# Patient Record
Sex: Female | Born: 1955 | Race: White | Hispanic: No | Marital: Married | Smoking: Current every day smoker
Health system: Southern US, Community
[De-identification: ages and names within clinical notes are randomized; demographics above are authoritative.]

## PROBLEM LIST (undated history)

## (undated) DIAGNOSIS — R42 Dizziness and giddiness: Secondary | ICD-10-CM

## (undated) DIAGNOSIS — S46219A Strain of muscle, fascia and tendon of other parts of biceps, unspecified arm, initial encounter: Secondary | ICD-10-CM

## (undated) DIAGNOSIS — Z86718 Personal history of other venous thrombosis and embolism: Secondary | ICD-10-CM

## (undated) DIAGNOSIS — E119 Type 2 diabetes mellitus without complications: Secondary | ICD-10-CM

## (undated) DIAGNOSIS — M1990 Unspecified osteoarthritis, unspecified site: Secondary | ICD-10-CM

## (undated) DIAGNOSIS — G43909 Migraine, unspecified, not intractable, without status migrainosus: Secondary | ICD-10-CM

## (undated) HISTORY — PX: BICEPS TENDON REPAIR: SHX566

## (undated) HISTORY — PX: OTHER SURGICAL HISTORY: SHX169

## (undated) HISTORY — PX: SPINAL FUSION: SHX223

---

## 1978-03-05 DIAGNOSIS — I2699 Other pulmonary embolism without acute cor pulmonale: Secondary | ICD-10-CM

## 1978-03-05 HISTORY — DX: Other pulmonary embolism without acute cor pulmonale: I26.99

## 2006-03-05 HISTORY — PX: OTHER SURGICAL HISTORY: SHX169

## 2011-03-06 DIAGNOSIS — Z86718 Personal history of other venous thrombosis and embolism: Secondary | ICD-10-CM

## 2011-03-06 HISTORY — DX: Personal history of other venous thrombosis and embolism: Z86.718

## 2014-11-02 ENCOUNTER — Ambulatory Visit: Payer: BLUE CROSS/BLUE SHIELD

## 2014-11-02 ENCOUNTER — Encounter: Payer: Self-pay | Admitting: Emergency Medicine

## 2014-11-02 ENCOUNTER — Ambulatory Visit
Admission: EM | Admit: 2014-11-02 | Discharge: 2014-11-02 | Disposition: A | Discharge status: Home / Self Care | Payer: BLUE CROSS/BLUE SHIELD | Attending: Family Medicine | Admitting: Family Medicine

## 2014-11-02 DIAGNOSIS — S60052A Contusion of left little finger without damage to nail, initial encounter: Secondary | ICD-10-CM

## 2014-11-02 HISTORY — DX: Type 2 diabetes mellitus without complications: E11.9

## 2014-11-02 HISTORY — DX: Personal history of other venous thrombosis and embolism: Z86.718

## 2014-11-02 HISTORY — DX: Strain of muscle, fascia and tendon of other parts of biceps, unspecified arm, initial encounter: S46.219A

## 2014-11-02 NOTE — Discharge Instructions (Signed)
Take home medication as prescribed. Apply ice and elevate.   Follow up with orthopedic or primary care physician this week as needed for continued pain. Return to Urgent care or ER for increased pain, decreased sensation or movement, redness, new or worsening concerns.  Contusion A contusion is a deep bruise. Contusions are the result of an injury that caused bleeding under the skin. The contusion may turn blue, purple, or yellow. Minor injuries will give you a painless contusion, but more severe contusions may stay painful and swollen for a few weeks.  CAUSES  A contusion is usually caused by a blow, trauma, or direct force to an area of the body. SYMPTOMS   Swelling and redness of the injured area.  Bruising of the injured area.  Tenderness and soreness of the injured area.  Pain. DIAGNOSIS  The diagnosis can be made by taking a history and physical exam. An X-ray, CT scan, or MRI may be needed to determine if there were any associated injuries, such as fractures. TREATMENT  Specific treatment will depend on what area of the body was injured. In general, the best treatment for a contusion is resting, icing, elevating, and applying cold compresses to the injured area. Over-the-counter medicines may also be recommended for pain control. Ask your caregiver what the best treatment is for your contusion. HOME CARE INSTRUCTIONS   Put ice on the injured area.  Put ice in a plastic bag.  Place a towel between your skin and the bag.  Leave the ice on for 15-20 minutes, 3-4 times a day, or as directed by your health care provider.  Only take over-the-counter or prescription medicines for pain, discomfort, or fever as directed by your caregiver. Your caregiver may recommend avoiding anti-inflammatory medicines (aspirin, ibuprofen, and naproxen) for 48 hours because these medicines may increase bruising.  Rest the injured area.  If possible, elevate the injured area to reduce swelling. SEEK  IMMEDIATE MEDICAL CARE IF:   You have increased bruising or swelling.  You have pain that is getting worse.  Your swelling or pain is not relieved with medicines. MAKE SURE YOU:   Understand these instructions.  Will watch your condition.  Will get help right away if you are not doing well or get worse. Document Released: 11/29/2004 Document Revised: 02/24/2013 Document Reviewed: 12/25/2010 [facility] Patient Information 2015 [place], [place]. This information is not intended to replace advice given to you by your health care provider. Make sure you discuss any questions you have with your health care provider.

## 2014-11-02 NOTE — ED Notes (Signed)
Left pinky finger, stiff , painful and cannot move since this morninf

## 2014-11-02 NOTE — ED Provider Notes (Signed)
[facility] Emergency Department Provider Note  ____________________________________________  Time seen: Approximately 6:30 PM  I have reviewed the triage vital signs and the nursing notes.   HISTORY  Chief Complaint Finger Injury   HPI Nicole Lewis is a 59 y.o. female patient presents for the complaints of left pinky pain. Patient reports that pain has been present all day. Patient and spouse reports that yesterdayshe was doing a lot of cleaning and states that she did hit her hands multiple time. Patient states though that she felt fine last night. Denies fall or other injury. States pain is in left fifth finger. Denies radiation. Denies numbness or tingling sensation. States still has full range of motion but pain present with movement. States minimal pain at rest without movement. States that she took a home Norco earlier today which helped with pain. States here as she wanted to make sure it was not broken.   States current pain as 7 out of 10 and aching. Denies other pain. States that she is right-hand dominant. Denies break in skin, fever, numbness or other complaints.   Past Medical History  Diagnosis Date  . Diabetes mellitus without complication   . H/O blood clots   . Biceps muscle strain     right    There are no active problems to display for this patient.   Past Surgical History  Procedure Laterality Date  . Spinal fusion    . Biceps tendon repair    . Spinal fusion    . Aortic valve replacement (avr)/coronary artery bypass grafting (cabg)      Current Outpatient Rx  Name  Route  Sig  Dispense  Refill  . canagliflozin (INVOKANA) 300 MG TABS tablet   Oral   Take 300 mg by mouth daily before breakfast.         . carvedilol (COREG) 6.25 MG tablet   Oral   Take 6.25 mg by mouth 2 (two) times daily with a meal.         . gabapentin (NEURONTIN) 600 MG tablet   Oral   Take 600 mg by mouth 3 (three) times daily. 600,g in AM  1200 in PM         . HYDROcodone-acetaminophen (NORCO) 7.5-325 MG per tablet   Oral   Take 1 tablet by mouth every 4 (four) hours as needed for moderate pain.         Marland Kitchen insulin glargine (LANTUS) 100 UNIT/ML injection   Subcutaneous   Inject 50 Units into the skin at bedtime.         . insulin lispro (HUMALOG) 100 UNIT/ML injection   Subcutaneous   Inject 15 Units into the skin 3 (three) times daily before meals.         . metFORMIN (GLUCOPHAGE-XR) 750 MG 24 hr tablet   Oral   Take 750 mg by mouth 2 (two) times daily.         Marland Kitchen PARoxetine (PAXIL) 40 MG tablet   Oral   Take 40 mg by mouth every morning.         Marland Kitchen spironolactone (ALDACTONE) 25 MG tablet   Oral   Take 25 mg by mouth once.         Marland Kitchen tiZANidine (ZANAFLEX) 4 MG capsule   Oral   Take 4 mg by mouth 3 (three) times daily.         Marland Kitchen warfarin (COUMADIN) 3 MG tablet   Oral   Take 3 mg  by mouth daily. Alternate Andris Flurry Sat Sun with 1.5 mg           Allergies Contrast media and Tetracyclines & related  Family History  Problem Relation Age of Onset  . Diabetes Mother   . Cancer Mother     Social History Social History  Substance Use Topics  . Smoking status: Former Games developer  . Smokeless tobacco: Never Used  . Alcohol Use: No    Review of Systems Constitutional: No fever/chills Eyes: No visual changes. ENT: No sore throat. Cardiovascular: Denies chest pain. Respiratory: Denies shortness of breath. Gastrointestinal: No abdominal pain.  No nausea, no vomiting.  No diarrhea.  No constipation. Genitourinary: Negative for dysuria. Musculoskeletal: Negative for back pain. Left 5 finger pain.  Skin: Negative for rash. Neurological: Negative for headaches, focal weakness or numbness.  10-point ROS otherwise negative.  ____________________________________________   PHYSICAL EXAM:  VITAL SIGNS: ED Triage Vitals  Enc Vitals Group     BP 11/02/14 1744 140/82 mmHg     Pulse Rate 11/02/14  1744 79     Resp --      Temp 11/02/14 1744 98.1 F (36.7 C)     Temp Source 11/02/14 1744 Tympanic     SpO2 11/02/14 1744 98 %     Weight 11/02/14 1744 220 lb (99.791 kg)     Height 11/02/14 1744 5\' 5"  (1.651 m)     Head Cir --      Peak Flow --      Pain Score 11/02/14 1803 7     Pain Loc --      Pain Edu? --      Excl. in GC? --     Constitutional: Alert and oriented. Well appearing and in no acute distress. Eyes: Conjunctivae are normal. PERRL. EOMI. Head: Atraumatic.  Nose: No congestion/rhinnorhea.  Mouth/Throat: Mucous membranes are moist.  O Neck: No stridor.  No cervical spine tenderness to palpation. Hematological/Lymphatic/Immunilogical: No cervical lymphadenopathy. Cardiovascular: Normal rate, regular rhythm. Grossly normal heart sounds.  Good peripheral circulation. Respiratory: Normal respiratory effort.  No retractions. Lungs CTAB. Gastrointestinal: Soft and nontender. No distention. Normal Bowel sounds.   Musculoskeletal: No lower or upper extremity tenderness nor edema.  No joint effusions. Bilateral pedal pulses equal and easily palpated.  Except: left 5th mcp mild to mod TTP, minimal swelling and ecchymosis, left hand otherwise nontender, no tendon or motor deficit, no sensation deficit, cap refill <2secs.No erythema, no signs of infection. Distal radial pulses equal bilaterally. Bilateral hand grips equal.  Neurologic:  Normal speech and language. No gross focal neurologic deficits are appreciated. No gait instability. Skin:  Skin is warm, dry and intact. No rash noted. Psychiatric: Mood and affect are normal. Speech and behavior are normal.  ____________________________________________   LABS (all labs ordered are listed, but only abnormal results are displayed)  Labs Reviewed - No data to display ____________________________________________  RADIOLOGY LEFT HAND - COMPLETE 3+ VIEW  COMPARISON: None.  FINDINGS: There is no evidence of fracture or  dislocation. There is no evidence of arthropathy or other focal bone abnormality. Soft tissues are unremarkable.  IMPRESSION: Negative.   Electronically Signed By: Sherian Rein M.D. On: 11/02/2014 18:41  I, Renford Dills, personally viewed and evaluated these images (plain radiographs) as part of my medical decision making.   __________________________________________   PROCEDURES  Procedure(s) performed:   Left 5 finger splint and buddy tape 4/5 fingers by RN. Neurovascular intact post.  ____________________________________________   INITIAL IMPRESSION / ASSESSMENT  AND PLAN / ED COURSE  Pertinent labs & imaging results that were available during my care of the patient were reviewed by me and considered in my medical decision making (see chart for details).  Very well-appearing patient. Presents for the complaints of left fifth digit MCP pain. Pain fully reproducible on palpation per patient. No signs of infection. No erythema. Skin intact. Distal radial pulses intact bilaterally. Cap refill less than 2 seconds. Full range of motion and no tendon deficit. X-ray negative. Ice elevate will buddy tape fourth and fifth fingers for support. Reports has home pain medication as needed.  Follow up with PCP or ortho as needed. Discussed immediate return parameters include decreased sensation or movement, increased pain, redness or other concerns. Patient and spouse verbalized understanding and agreed to plan.  ____________________________________________   FINAL CLINICAL IMPRESSION(S) / ED DIAGNOSES  Final diagnoses:  Contusion of fifth finger, left, initial encounter       Renford Dills, NP 11/02/14 2145

## 2015-03-02 ENCOUNTER — Ambulatory Visit
Admission: EM | Admit: 2015-03-02 | Discharge: 2015-03-02 | Disposition: A | Discharge status: Home / Self Care | Payer: BLUE CROSS/BLUE SHIELD | Attending: Family Medicine | Admitting: Family Medicine

## 2015-03-02 DIAGNOSIS — J320 Chronic maxillary sinusitis: Secondary | ICD-10-CM

## 2015-03-02 DIAGNOSIS — G43809 Other migraine, not intractable, without status migrainosus: Secondary | ICD-10-CM

## 2015-03-02 MED ORDER — AMOXICILLIN-POT CLAVULANATE 875-125 MG PO TABS: 1.0000 | ORAL_TABLET | Freq: Two times a day (BID) | ORAL | Status: DC

## 2015-03-02 MED ORDER — ONDANSETRON 8 MG PO TBDP
8.0000 mg | ORAL_TABLET | Freq: Once | ORAL | Status: AC
  Administered 2015-03-02: 8 mg via ORAL

## 2015-03-02 MED ORDER — KETOROLAC TROMETHAMINE 60 MG/2ML IM SOLN
60.0000 mg | Freq: Once | INTRAMUSCULAR | Status: AC
  Administered 2015-03-02: 60 mg via INTRAMUSCULAR

## 2015-03-02 NOTE — ED Notes (Signed)
Started yesterday with sinusitis and right facial pain. This afternoon started with Migraine headache + nausea and photophobic

## 2015-03-02 NOTE — ED Provider Notes (Signed)
CSN: [id number]     Arrival date & time 03/02/15  1738 History   First MD Initiated Contact with Patient 03/02/15 1942     Chief Complaint  Patient presents with  . Facial Pain  . Migraine   (Consider location/radiation/quality/duration/timing/severity/associated sxs/prior Treatment) HPI Comments: 59 yo diabetic female with a h/o intermitent migraines (infrequent), diabetes, presents with a recent h/o runny nose, nasal congestion and now sinus pressure (right side)  sinus pain (right side), sinus headaches, dental pain. Today patient developed a right sided migraine headache with nausea and photophobia. Denies any fevers, chills, dental procedures,chest pains or shortness of breath.   The history is provided by the patient.    Past Medical History  Diagnosis Date  . Diabetes mellitus without complication (HCC)   . H/O blood clots   . Biceps muscle strain     right  . Depression    Past Surgical History  Procedure Laterality Date  . Spinal fusion    . Biceps tendon repair    . Spinal fusion    . Aortic ileac bypass     Family History  Problem Relation Age of Onset  . Diabetes Mother   . Cancer Mother   . Cancer Father   . Diabetes Father    Social History  Substance Use Topics  . Smoking status: Former Games developermoker  . Smokeless tobacco: Never Used  . Alcohol Use: No   OB History    No data available     Review of Systems  Allergies  Contrast media and Tetracyclines & related  Home Medications   Prior to Admission medications   Medication Sig Start Date End Date Taking? Authorizing Provider  carvedilol (COREG) 6.25 MG tablet Take 6.25 mg by mouth 2 (two) times daily with a meal.   Yes Historical Provider, MD  gabapentin (NEURONTIN) 600 MG tablet Take 600 mg by mouth 3 (three) times daily. 600,g in AM 1200 in PM   Yes Historical Provider, MD  HYDROcodone-acetaminophen (NORCO) 7.5-325 MG per tablet Take 1 tablet by mouth every 4 (four) hours as needed for moderate  pain.   Yes Historical Provider, MD  insulin glargine (LANTUS) 100 UNIT/ML injection Inject 50 Units into the skin at bedtime.   Yes Historical Provider, MD  insulin lispro (HUMALOG) 100 UNIT/ML injection Inject 15 Units into the skin 3 (three) times daily before meals.   Yes Historical Provider, MD  metFORMIN (GLUCOPHAGE-XR) 750 MG 24 hr tablet Take 750 mg by mouth 2 (two) times daily.   Yes Historical Provider, MD  PARoxetine (PAXIL) 40 MG tablet Take 40 mg by mouth every morning.   Yes Historical Provider, MD  spironolactone (ALDACTONE) 25 MG tablet Take 25 mg by mouth once.   Yes Historical Provider, MD  tiZANidine (ZANAFLEX) 4 MG capsule Take 4 mg by mouth 3 (three) times daily.   Yes Historical Provider, MD  warfarin (COUMADIN) 3 MG tablet Take 3 mg by mouth daily. Alternate Tue Thur Sat Sun with 1.5 mg   Yes Historical Provider, MD  amoxicillin-clavulanate (AUGMENTIN) 875-125 MG tablet Take 1 tablet by mouth 2 (two) times daily. 03/02/15   Payton Mccallumrlando Shenise Wolgamott, MD  canagliflozin (INVOKANA) 300 MG TABS tablet Take 300 mg by mouth daily before breakfast.    Historical Provider, MD   Meds Ordered and Administered this Visit   Medications  ketorolac (TORADOL) injection 60 mg (60 mg Intramuscular Given 03/02/15 1958)  ondansetron (ZOFRAN-ODT) disintegrating tablet 8 mg (8 mg Oral Given 03/02/15 1958)  BP 148/63 mmHg  Pulse 76  Temp(Src) 97.7 F (36.5 C) (Tympanic)  Resp 16  Ht  (1.651 m)  Wt 228 lb (103.42 kg)  BMI 37.94 kg/m2  SpO2 97% No data found.   Physical Exam  Constitutional: She is oriented to person, place, and time. She appears well-developed and well-nourished. No distress.  HENT:  Head: Normocephalic and atraumatic.  Right Ear: Tympanic membrane, external ear and ear canal normal.  Left Ear: Tympanic membrane, external ear and ear canal normal.  Nose: Mucosal edema and rhinorrhea present. No nose lacerations, sinus tenderness, nasal deformity, septal deviation or  nasal septal hematoma. No epistaxis.  No foreign bodies. Right sinus exhibits maxillary sinus tenderness and frontal sinus tenderness. Left sinus exhibits maxillary sinus tenderness and frontal sinus tenderness.  Mouth/Throat: Uvula is midline, oropharynx is clear and moist and mucous membranes are normal. No oropharyngeal exudate, posterior oropharyngeal edema, posterior oropharyngeal erythema or tonsillar abscesses.  Eyes: Conjunctivae and EOM are normal. Pupils are equal, round, and reactive to light. Right eye exhibits no discharge. Left eye exhibits no discharge. No scleral icterus.  Neck: Normal range of motion. Neck supple. No thyromegaly present.  Cardiovascular: Normal rate, regular rhythm and normal heart sounds.   Pulmonary/Chest: Effort normal and breath sounds normal. No respiratory distress. She has no wheezes. She has no rales.  Lymphadenopathy:    She has no cervical adenopathy.  Neurological: She is alert and oriented to person, place, and time. She has normal reflexes. No cranial nerve deficit. She exhibits normal muscle tone. Coordination normal.  Skin: No rash noted. She is not diaphoretic.  Nursing note and vitals reviewed.   ED Course  Procedures (including critical care time)  Labs Review Labs Reviewed - No data to display  Imaging Review No results found.   Visual Acuity Review  Right Eye Distance:   Left Eye Distance:   Bilateral Distance:    Right Eye Near:   Left Eye Near:    Bilateral Near:         MDM   1. Right maxillary sinusitis   2. Other migraine without status migrainosus, not intractable    Discharge Medication List as of 03/02/2015  8:36 PM    START taking these medications   Details  amoxicillin-clavulanate (AUGMENTIN) 875-125 MG tablet Take 1 tablet by mouth 2 (two) times daily., Starting 03/02/2015, Until Discontinued, Normal       1. diagnosis reviewed with patient 2. rx as per orders above; reviewed possible side effects,  interactions, risks and benefits  3. Patient given toradol  IM x1 and zofran  po with improvement of symptoms 4. Recommend supportive treatment with otc analgesics, otc flonase, home pain medication (Norco) prn 5. Follow-up prn if symptoms worsen or don't improve    Payton Mccallum, MD 03/02/15 2050

## 2015-05-14 ENCOUNTER — Encounter: Payer: Self-pay | Admitting: Emergency Medicine

## 2015-05-14 ENCOUNTER — Emergency Department
Admission: EM | Admit: 2015-05-14 | Discharge: 2015-05-14 | Disposition: A | Discharge status: Home / Self Care | Payer: BLUE CROSS/BLUE SHIELD | Attending: Emergency Medicine | Admitting: Emergency Medicine

## 2015-05-14 DIAGNOSIS — Z87891 Personal history of nicotine dependence: Secondary | ICD-10-CM | POA: Insufficient documentation

## 2015-05-14 DIAGNOSIS — G8929 Other chronic pain: Secondary | ICD-10-CM | POA: Insufficient documentation

## 2015-05-14 DIAGNOSIS — M545 Low back pain: Secondary | ICD-10-CM | POA: Diagnosis present

## 2015-05-14 DIAGNOSIS — Z792 Long term (current) use of antibiotics: Secondary | ICD-10-CM | POA: Diagnosis not present

## 2015-05-14 DIAGNOSIS — Z794 Long term (current) use of insulin: Secondary | ICD-10-CM | POA: Insufficient documentation

## 2015-05-14 DIAGNOSIS — Z79899 Other long term (current) drug therapy: Secondary | ICD-10-CM | POA: Diagnosis not present

## 2015-05-14 DIAGNOSIS — Z7901 Long term (current) use of anticoagulants: Secondary | ICD-10-CM | POA: Diagnosis not present

## 2015-05-14 DIAGNOSIS — Z7984 Long term (current) use of oral hypoglycemic drugs: Secondary | ICD-10-CM | POA: Diagnosis not present

## 2015-05-14 DIAGNOSIS — E119 Type 2 diabetes mellitus without complications: Secondary | ICD-10-CM | POA: Diagnosis not present

## 2015-05-14 DIAGNOSIS — M62830 Muscle spasm of back: Secondary | ICD-10-CM | POA: Diagnosis not present

## 2015-05-14 DIAGNOSIS — M5442 Lumbago with sciatica, left side: Secondary | ICD-10-CM | POA: Diagnosis not present

## 2015-05-14 MED ORDER — DIAZEPAM 5 MG PO TABS
5.0000 mg | ORAL_TABLET | Freq: Once | ORAL | Status: AC
  Administered 2015-05-14: 5 mg via ORAL
  Filled 2015-05-14: qty 1

## 2015-05-14 MED ORDER — HYDROMORPHONE HCL 1 MG/ML IJ SOLN
1.0000 mg | Freq: Once | INTRAMUSCULAR | Status: AC
  Administered 2015-05-14: 1 mg via INTRAMUSCULAR
  Filled 2015-05-14: qty 1

## 2015-05-14 NOTE — ED Notes (Signed)
Patient has hx of lumbar spinal fusion (L4, L5) and is on chronic pain mgt.  She states she has never has spasms like this before.  She is concerned about her right leg weakness which coincides with her severe spasms.  She consulted [employer] Nurse line and was told to seek tx here.

## 2015-05-14 NOTE — ED Notes (Addendum)
Pt c/o back pain and spasms across her lower back since 1130 today; [facility] and has taken her medications she's allowed at home; has not given her any relief; no relief with heat either; pt says pain is increasing; denies new injury

## 2015-05-14 NOTE — ED Provider Notes (Signed)
[facility] Emergency Department Provider Note  ____________________________________________  Time seen: Approximately 10:17 PM  I have reviewed the triage vital signs and the nursing notes.   HISTORY  Chief Complaint Back Pain    HPI Nicole Lewis is a 60 y.o. female here with complaint of back pain with severe spasms starting today. Patient states that prior to her muscle spasms she was cleaning out the refrigerator and then later went to the grocery store with her husband. She denies any known injury but suffers from chronic pain of her back and has had a lumbar spinal fusion at L4-L5. Patient is managed by a chronic pain clinic and is taking her medication today without any relief. She denies any incontinence of bowel or bladder. She states that she is only taking 1 pain pill today and is taken Zanaflex at approximately 12:00. She is also had problems with sciatica in her left leg that now feels some numbness radiating into her right leg. She is continued to be ambulatory since her pain started. Currently she rates her pain is 9/10.   Past Medical History  Diagnosis Date  . Diabetes mellitus without complication (HCC)   . H/O blood clots   . Biceps muscle strain     right    There are no active problems to display for this patient.   Past Surgical History  Procedure Laterality Date  . Spinal fusion    . Biceps tendon repair    . Spinal fusion    . Aortic ileac bypass      Current Outpatient Rx  Name  Route  Sig  Dispense  Refill  . amoxicillin-clavulanate (AUGMENTIN) 875-125 MG tablet   Oral   Take 1 tablet by mouth 2 (two) times daily.   20 tablet   0   . canagliflozin (INVOKANA) 300 MG TABS tablet   Oral   Take 300 mg by mouth daily before breakfast.         . carvedilol (COREG) 6.25 MG tablet   Oral   Take 6.25 mg by mouth 2 (two) times daily with a meal.         . gabapentin (NEURONTIN) 600 MG tablet   Oral   Take 600 mg by  mouth 3 (three) times daily. 600,g in AM 1200 in PM         . HYDROcodone-acetaminophen (NORCO) 7.5-325 MG per tablet   Oral   Take 1 tablet by mouth every 4 (four) hours as needed for moderate pain.         Marland Kitchen insulin glargine (LANTUS) 100 UNIT/ML injection   Subcutaneous   Inject 50 Units into the skin at bedtime.         . insulin lispro (HUMALOG) 100 UNIT/ML injection   Subcutaneous   Inject 15 Units into the skin 3 (three) times daily before meals.         . metFORMIN (GLUCOPHAGE-XR) 750 MG 24 hr tablet   Oral   Take 750 mg by mouth 2 (two) times daily.         Marland Kitchen PARoxetine (PAXIL) 40 MG tablet   Oral   Take 40 mg by mouth every morning.         Marland Kitchen spironolactone (ALDACTONE) 25 MG tablet   Oral   Take 25 mg by mouth once.         Marland Kitchen tiZANidine (ZANAFLEX) 4 MG capsule   Oral   Take 4 mg by mouth 3 (three) times  daily.         . warfarin (COUMADIN) 3 MG tablet   Oral   Take 3 mg by mouth daily. Alternate Andris Flurryue Thur Sat Sun with 1.5 mg           Allergies Contrast media and Tetracyclines & related  Family History  Problem Relation Age of Onset  . Diabetes Mother   . Cancer Mother   . Cancer Father   . Diabetes Father     Social History Social History  Substance Use Topics  . Smoking status: Former Games developermoker  . Smokeless tobacco: Never Used  . Alcohol Use: No    Review of Systems Constitutional: No fever/chills Cardiovascular: Denies chest pain. Respiratory: Denies shortness of breath. Gastrointestinal: No abdominal pain.  No nausea, no vomiting.  No diarrhea.  No constipation. Genitourinary: Negative for dysuria. Musculoskeletal: Positive for chronic back pain. Skin: Negative for rash. Neurological: Negative for headaches, focal weakness. Left sciatica with mild sensation changes in the right leg.  10-point ROS otherwise negative.  ____________________________________________   PHYSICAL EXAM:  VITAL SIGNS: ED Triage Vitals  Enc  Vitals Group     BP 05/14/15 2033 124/76 mmHg     Pulse Rate 05/14/15 2033 85     Resp 05/14/15 2033 18     Temp 05/14/15 2033 98.1 F (36.7 C)     Temp Source 05/14/15 2033 Oral     SpO2 05/14/15 2033 97 %     Weight 05/14/15 2033 222 lb (100.699 kg)     Height 05/14/15 2033 5\' 5"  (1.651 m)     Head Cir --      Peak Flow --      Pain Score 05/14/15 2034 9     Pain Loc --      Pain Edu? --      Excl. in GC? --     Constitutional: Alert and oriented. Well appearing and in no acute distress. Eyes: Conjunctivae are normal. PERRL. EOMI. Head: Atraumatic. Nose: No congestion/rhinnorhea. Neck: No stridor.   Cardiovascular: Normal rate, regular rhythm. Grossly normal heart sounds.  Good peripheral circulation. Respiratory: Normal respiratory effort.  No retractions. Lungs CTAB. Gastrointestinal: Soft and nontender. No distention. No abdominal bruits. Musculoskeletal: Examination of the lower back there is no gross deformity however there is marked tenderness on palpation of L5-S1 area along with paravertebral muscles bilaterally. Range of motion is restricted secondary to patient's pain. Range of motion increases pain. Reflexes were 1+ bilaterally. There is good muscle strength in both legs. Motor sensory function intact. Gait was not tested secondary to patient's pain. Neurologic:  Normal speech and language. No gross focal neurologic deficits are appreciated.  Skin:  Skin is warm, dry and intact. No rash noted. Psychiatric: Mood and affect are normal. Speech and behavior are normal.  ____________________________________________   LABS (all labs ordered are listed, but only abnormal results are displayed)  Labs Reviewed - No data to display   ____________________________________________   PROCEDURES  Procedure(s) performed: None  Critical Care performed: No  ____________________________________________   INITIAL IMPRESSION / ASSESSMENT AND PLAN / ED COURSE  Pertinent  labs & imaging results that were available during my care of the patient were reviewed by me and considered in my medical decision making (see chart for details).  ----------------------------------------- 11:32 PM on 05/14/2015 ----------------------------------------- Patient is resting comfortably Loc after having had Dilaudid and Valium there is no continued muscle spasms. Patient is to increase her Zanaflex at home every 8 hours as  needed for muscle spasms and continue her normal medication. She is to follow-up with her doctor if any continued problems.  ____________________________________________   FINAL CLINICAL IMPRESSION(S) / ED DIAGNOSES  Final diagnoses:  Bilateral low back pain with left-sided sciatica  Spasm of lumbar paraspinous muscle      Tommi Rumps, PA-C 05/14/15 2332  Sharyn Creamer, MD 05/14/15 2357

## 2015-05-14 NOTE — Discharge Instructions (Signed)
Follow-up with your doctor if any continued problems. Increased Zanaflex to  every 8 hours as needed for muscle spasms. Continue your normal medication as directed

## 2016-06-29 ENCOUNTER — Emergency Department
Admission: EM | Admit: 2016-06-29 | Discharge: 2016-06-29 | Disposition: A | Discharge status: Home / Self Care | Payer: BLUE CROSS/BLUE SHIELD | Attending: Emergency Medicine | Admitting: Emergency Medicine

## 2016-06-29 DIAGNOSIS — M545 Low back pain, unspecified: Secondary | ICD-10-CM

## 2016-06-29 DIAGNOSIS — Z794 Long term (current) use of insulin: Secondary | ICD-10-CM | POA: Insufficient documentation

## 2016-06-29 DIAGNOSIS — E119 Type 2 diabetes mellitus without complications: Secondary | ICD-10-CM | POA: Diagnosis not present

## 2016-06-29 DIAGNOSIS — Z87891 Personal history of nicotine dependence: Secondary | ICD-10-CM | POA: Diagnosis not present

## 2016-06-29 DIAGNOSIS — M5442 Lumbago with sciatica, left side: Secondary | ICD-10-CM | POA: Insufficient documentation

## 2016-06-29 DIAGNOSIS — Z7901 Long term (current) use of anticoagulants: Secondary | ICD-10-CM | POA: Diagnosis not present

## 2016-06-29 DIAGNOSIS — G8929 Other chronic pain: Secondary | ICD-10-CM | POA: Insufficient documentation

## 2016-06-29 MED ORDER — ORPHENADRINE CITRATE 30 MG/ML IJ SOLN
60.0000 mg | Freq: Two times a day (BID) | INTRAMUSCULAR | Status: DC
  Administered 2016-06-29: 60 mg via INTRAMUSCULAR

## 2016-06-29 MED ORDER — DIAZEPAM 5 MG PO TABS
5.0000 mg | ORAL_TABLET | Freq: Once | ORAL | Status: AC
  Administered 2016-06-29: 5 mg via ORAL
  Filled 2016-06-29: qty 1

## 2016-06-29 MED ORDER — HYDROMORPHONE HCL 1 MG/ML IJ SOLN
0.5000 mg | Freq: Once | INTRAMUSCULAR | Status: AC
  Administered 2016-06-29: 0.5 mg via INTRAVENOUS
  Filled 2016-06-29: qty 1

## 2016-06-29 MED ORDER — ORPHENADRINE CITRATE 30 MG/ML IJ SOLN
INTRAMUSCULAR | Status: AC
  Administered 2016-06-29: 60 mg via INTRAMUSCULAR
  Filled 2016-06-29: qty 2

## 2016-06-29 MED ORDER — KETOROLAC TROMETHAMINE 30 MG/ML IJ SOLN
15.0000 mg | Freq: Once | INTRAMUSCULAR | Status: AC
  Administered 2016-06-29: 15 mg via INTRAVENOUS
  Filled 2016-06-29: qty 1

## 2016-06-29 NOTE — ED Provider Notes (Signed)
[facility] Emergency Department Provider Note   ____________________________________________   First MD Initiated Contact with Patient 06/29/16 1816     (approximate)  I have reviewed the triage vital signs and the nursing notes.   HISTORY  Chief Complaint Back Pain    HPI Nicole Lewis is a 61 y.o. female is here with complaint of low back pain.Patient states she has had 2 back surgeries in the past with the most recent in 2008.  She denies any recent injuries. Patient states she experienced back spasms with increased pain for the last 4 days. Generally she takes Norco and Zanaflex and did so this morning at approximately 5 AM. She states she has not taken any further medication today because she had to drive her to doctor's appointments and her arm. She states that when she got home she was unable to get out of the car. Her doctor's appointments in [place] today were unrelated to her back . Patient denies any urinary symptoms however she has had a history of kidney stones. Patient states that currently she has back pain with radiation into her left leg. She denies any paresthesias other than her radiculopathy, no incontinence of bowel or bladder or saddle anesthesias. Patient rates her pain as 10 over 10 at this time. Her pain is magnified with any amount of movement.   Past Medical History:  Diagnosis Date  . Biceps muscle strain    right  . Diabetes mellitus without complication (HCC)   . H/O blood clots     There are no active problems to display for this patient.   Past Surgical History:  Procedure Laterality Date  . aortic ileac bypass    . BICEPS TENDON REPAIR    . SPINAL FUSION    . SPINAL FUSION      Prior to Admission medications   Medication Sig Start Date End Date Taking? Authorizing Provider  canagliflozin (INVOKANA) 300 MG TABS tablet Take 300 mg by mouth daily before breakfast.    Historical Provider, MD  carvedilol (COREG) 6.25  MG tablet Take 6.25 mg by mouth 2 (two) times daily with a meal.    Historical Provider, MD  gabapentin (NEURONTIN) 600 MG tablet Take 600 mg by mouth 3 (three) times daily. 600,g in AM 1200 in PM    Historical Provider, MD  HYDROcodone-acetaminophen (NORCO) 7.5-325 MG per tablet Take 1 tablet by mouth every 4 (four) hours as needed for moderate pain.    Historical Provider, MD  insulin glargine (LANTUS) 100 UNIT/ML injection Inject 50 Units into the skin at bedtime.    Historical Provider, MD  insulin lispro (HUMALOG) 100 UNIT/ML injection Inject 15 Units into the skin 3 (three) times daily before meals.    Historical Provider, MD  metFORMIN (GLUCOPHAGE-XR) 750 MG 24 hr tablet Take 750 mg by mouth 2 (two) times daily.    Historical Provider, MD  PARoxetine (PAXIL) 40 MG tablet Take 40 mg by mouth every morning.    Historical Provider, MD  spironolactone (ALDACTONE) 25 MG tablet Take 25 mg by mouth once.    Historical Provider, MD  tiZANidine (ZANAFLEX) 4 MG capsule Take 4 mg by mouth 3 (three) times daily.    Historical Provider, MD  warfarin (COUMADIN) 3 MG tablet Take 3 mg by mouth daily. Alternate Andris Flurry Sat Sun with 1.5 mg    Historical Provider, MD    Allergies Contrast media [iodinated diagnostic agents] and Tetracyclines & related  Family History  Problem  Relation Age of Onset  . Diabetes Mother   . Cancer Mother   . Cancer Father   . Diabetes Father     Social History Social History  Substance Use Topics  . Smoking status: Former Games developer  . Smokeless tobacco: Never Used  . Alcohol use No    Review of Systems  Constitutional: No fever/chills Cardiovascular: Denies chest pain. Respiratory: Denies shortness of breath. Gastrointestinal: No abdominal pain.  No nausea, no vomiting.  Genitourinary: Negative for dysuria. Musculoskeletal:  Positive for acute and  chronic back pain. Positive for left leg radiculopathy. Skin: Negative for rash. Neurological: Negative for  focal  weakness or numbness. Endocrine:  Positive diabetes mellitus.  ____________________________________________   PHYSICAL EXAM:  VITAL SIGNS: ED Triage Vitals  Enc Vitals Group     BP 06/29/16 1731 (!) 142/55     Pulse Rate 06/29/16 1731 83     Resp 06/29/16 1731 18     Temp 06/29/16 1731 99.1 F (37.3 C)     Temp Source 06/29/16 1731 Oral     SpO2 06/29/16 1731 99 %     Weight 06/29/16 1732 228 lb (103.4 kg)     Height 06/29/16 1732  (1.651 m)     Head Circumference --      Peak Flow --      Pain Score 06/29/16 1731 10     Pain Loc --      Pain Edu? --      Excl. in GC? --     Constitutional: Alert and oriented. Well appearing and in no acute distress. Eyes: Conjunctivae are normal. PERRL. EOMI. Head: Atraumatic. Neck: No stridor.   Cardiovascular: Normal rate, regular rhythm. Grossly normal heart sounds.  Good peripheral circulation. Respiratory: Normal respiratory effort.  No retractions. Lungs CTAB. Gastrointestinal: Soft and nontender. No distention.  No CVA tenderness. Musculoskeletal: patient had a moderate amount of difficulty getting from the wheelchair to the stretcher and was supine when the examiner entered the room. Patient was unable to perform any movement without assistance. There is marked tenderness on palpation of the left lower lumbar paraspinous muscles. Active muscle spasms were noted. Straight leg raise is approximately 20 in the right leg and only approximately 10 in the left leg. Patient was able to flex and extend her feet against pressure without any difficulty.  Neurologic:  Normal speech and language. No gross focal neurologic deficits are appreciated.reflexes were 1+ bilaterally.  Skin:  Skin is warm, dry and intact. No rash noted. Psychiatric: Mood and affect are normal. Speech and behavior are normal.  ____________________________________________   LABS (all labs ordered are listed, but only abnormal results are displayed)  Labs  Reviewed - No data to display   PROCEDURES  Procedure(s) performed: None  Procedures  Critical Care performed: No  ____________________________________________   INITIAL IMPRESSION / ASSESSMENT AND PLAN / ED COURSE  Pertinent labs & imaging results that were available during my care of the patient were reviewed by me and considered in my medical decision making (see chart for details).  ----------------------------------------- 7:54 PM on 06/29/2016 ----------------------------------------- Patient is currently sitting on the side of the bed and states that she is feeling a lot  Better. Pain has resolved and patient is experiencing little to no muscle spasms at this time. Patient was instructed to follow-up with her primary care doctor or her pain clinic doctor at [facility]. She is continue her regular medication at home.       ____________________________________________  FINAL CLINICAL IMPRESSION(S) / ED DIAGNOSES  Final diagnoses:  Acute exacerbation of chronic low back pain  Acute left-sided low back pain with left-sided sciatica      NEW MEDICATIONS STARTED DURING THIS VISIT:  Current Discharge Medication List       Note:  This document was prepared using Dragon voice recognition software and may include unintentional dictation errors.    Tommi Rumps, PA-C 06/29/16 1956    Nita Sickle, MD 06/29/16 2104

## 2016-06-29 NOTE — ED Notes (Signed)
See triage note  States she developed lower back about 4 days ago  States pain is moving into left leg  She had some MD appts today in [place] and had to drive self  So pain has increased

## 2016-06-29 NOTE — ED Triage Notes (Signed)
Pt to triage via wheelchair. Helped from waiting chair to wheelchair by this RN. Pt states 4 days of back spasms and low back pain. Denies urinary symptoms. Alert and oriented. Denies injury or falls.

## 2016-06-29 NOTE — Discharge Instructions (Signed)
Continue regular medication at home. Use ice or heat to her back to help relieve your back pain. Call and Make an appointment with your pain management doctor or notify your primary care doctor if any continued medications are needed.

## 2017-02-08 ENCOUNTER — Emergency Department
Admission: EM | Admit: 2017-02-08 | Discharge: 2017-02-08 | Disposition: A | Discharge status: Home / Self Care | Payer: BLUE CROSS/BLUE SHIELD | Attending: Emergency Medicine | Admitting: Emergency Medicine

## 2017-02-08 ENCOUNTER — Emergency Department: Payer: BLUE CROSS/BLUE SHIELD

## 2017-02-08 DIAGNOSIS — M5442 Lumbago with sciatica, left side: Secondary | ICD-10-CM | POA: Insufficient documentation

## 2017-02-08 DIAGNOSIS — G8929 Other chronic pain: Secondary | ICD-10-CM

## 2017-02-08 DIAGNOSIS — Z87891 Personal history of nicotine dependence: Secondary | ICD-10-CM | POA: Insufficient documentation

## 2017-02-08 DIAGNOSIS — Z7901 Long term (current) use of anticoagulants: Secondary | ICD-10-CM | POA: Insufficient documentation

## 2017-02-08 DIAGNOSIS — Z794 Long term (current) use of insulin: Secondary | ICD-10-CM | POA: Insufficient documentation

## 2017-02-08 DIAGNOSIS — Z79899 Other long term (current) drug therapy: Secondary | ICD-10-CM | POA: Diagnosis not present

## 2017-02-08 DIAGNOSIS — M545 Low back pain: Secondary | ICD-10-CM | POA: Diagnosis present

## 2017-02-08 DIAGNOSIS — E119 Type 2 diabetes mellitus without complications: Secondary | ICD-10-CM | POA: Diagnosis not present

## 2017-02-08 MED ORDER — OMEPRAZOLE 10 MG PO CPDR: 10.0000 mg | DELAYED_RELEASE_CAPSULE | Freq: Every day | ORAL | 0 refills | Status: DC

## 2017-02-08 MED ORDER — DIAZEPAM 5 MG PO TABS
5.0000 mg | ORAL_TABLET | Freq: Once | ORAL | Status: AC
  Administered 2017-02-08: 5 mg via ORAL
  Filled 2017-02-08: qty 1

## 2017-02-08 MED ORDER — MELOXICAM 15 MG PO TABS: 15.0000 mg | ORAL_TABLET | Freq: Every day | ORAL | 0 refills | Status: DC

## 2017-02-08 MED ORDER — DIAZEPAM 2 MG PO TABS: 2.0000 mg | ORAL_TABLET | Freq: Three times a day (TID) | ORAL | 0 refills | Status: DC | PRN

## 2017-02-08 MED ORDER — HYDROMORPHONE HCL 1 MG/ML IJ SOLN
1.0000 mg | Freq: Once | INTRAMUSCULAR | Status: AC
  Administered 2017-02-08: 1 mg via INTRAMUSCULAR
  Filled 2017-02-08: qty 1

## 2017-02-08 NOTE — ED Provider Notes (Signed)
[facility] Emergency Department Provider Note  ____________________________________________  Time seen: Approximately 8:10 PM  I have reviewed the triage vital signs and the nursing notes.   HISTORY  Chief Complaint Back Pain    HPI Nicole Lewis is a 61 y.o. female who presents emergency department complaining of acute on chronic back pain.  Patient reports that she has had increasing back pain for the past several weeks.  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  Patient has chronic left-sided sciatica.  Patient denies any specific precipitating event causing her increased pain.  Patient denies any bowel or bladder dysfunction, saddle anesthesia, paresthesias.  Patient reports that she has been evaluated by orthopedics, neurosurgery, pain management, primary care.  At this point, patient is on chronic pain medication and is managed by her primary care provider.  Patient reports that she typically receives a shot of pain medication, prescription for Valium or other muscle relaxer and discharge.  Patient reports that the last time she had steroids crease of her blood sugar to over 700 with significant issues.  She reports that her endocrinologist has advised her never to take steroids again.  Patient has GI irritation with NSAID use but has never had a GI bleed and denies any chronic kidney issues.  Patient has been taking her prescribed Vicodin pain medication and tizanidine without relief of symptoms.  Patient denies any flank pain, hematuria, dysuria, polyuria.  No abdominal pain.  No other complaints at this time.  Past Medical History:  Diagnosis Date  . Biceps muscle strain    right  . Diabetes mellitus without complication (HCC)   . H/O blood clots     There are no active problems to display for this patient.   Past Surgical History:  Procedure Laterality Date  . aortic ileac bypass    . BICEPS TENDON  REPAIR    . SPINAL FUSION    . SPINAL FUSION      Prior to Admission medications   Medication Sig Start Date End Date Taking? Authorizing Provider  canagliflozin (INVOKANA) 300 MG TABS tablet Take 300 mg by mouth daily before breakfast.    [provider]  carvedilol (COREG) 6.25 MG tablet Take 6.25 mg by mouth 2 (two) times daily with a meal.    [provider]  diazepam (VALIUM) 2 MG tablet Take 1 tablet (2 mg total) by mouth every 8 (eight) hours as needed for muscle spasms. 02/08/17   Cuthriell, Delorise RoyalsJonathan D, PA-C  gabapentin (NEURONTIN) 600 MG tablet Take 600 mg by mouth 3 (three) times daily. 600,g in AM 1200 in PM    [provider]  HYDROcodone-acetaminophen (NORCO) 7.5-325 MG per tablet Take 1 tablet by mouth every 4 (four) hours as needed for moderate pain.    [provider]  insulin glargine (LANTUS) 100 UNIT/ML injection Inject 50 Units into the skin at bedtime.    [provider]  insulin lispro (HUMALOG) 100 UNIT/ML injection Inject 15 Units into the skin 3 (three) times daily before meals.    [provider]  meloxicam (MOBIC) 15 MG tablet Take 1 tablet (15 mg total) by mouth daily. 02/08/17   Cuthriell, Delorise RoyalsJonathan D, PA-C  metFORMIN (GLUCOPHAGE-XR) 750 MG 24 hr tablet Take 750 mg by mouth 2 (two) times daily.    [provider]  omeprazole (PRILOSEC) 10 MG capsule Take 1 capsule (10 mg total) by mouth daily. 02/08/17   Cuthriell, Christiane HaJonathan  D, PA-C  PARoxetine (PAXIL) 40 MG tablet Take 40 mg by mouth every morning.    [provider]  spironolactone (ALDACTONE) 25 MG tablet Take 25 mg by mouth once.    [provider]  tiZANidine (ZANAFLEX) 4 MG capsule Take 4 mg by mouth 3 (three) times daily.    [provider]  warfarin (COUMADIN) 3 MG tablet Take 3 mg by mouth daily. Alternate Nicole Lewis with 1.5 mg    [provider]    Allergies Contrast media [iodinated diagnostic agents]  and Tetracyclines & related  Family History  Problem Relation Age of Onset  . Diabetes Mother   . Cancer Mother   . Cancer Father   . Diabetes Father     Social History Social History   Tobacco Use  . Smoking status: Former Games developermoker  . Smokeless tobacco: Never Used  Substance Use Topics  . Alcohol use: No  . Drug use: No     Review of Systems  Constitutional: No fever/chills Eyes: No visual changes.  Cardiovascular: no chest pain. Respiratory: no cough. No SOB. Gastrointestinal: No abdominal pain.  No nausea, no vomiting.  No diarrhea.  No constipation. Genitourinary: Negative for dysuria. No hematuria Musculoskeletal: Positive for increasing lower back pain. Skin: Negative for rash, abrasions, lacerations, ecchymosis. Neurological: Negative for headaches, focal weakness or numbness. 10-point ROS otherwise negative.  ____________________________________________   PHYSICAL EXAM:  VITAL SIGNS: ED Triage Vitals [02/08/17 1913]  Enc Vitals Group     BP (!) 122/59     Pulse Rate 66     Resp 16     Temp 98.4 F (36.9 C)     Temp Source Oral     SpO2 94 %     Weight 221 lb (100.2 kg)     Height 5\' 5"  (1.651 m)     Head Circumference      Peak Flow      Pain Score 9     Pain Loc      Pain Edu?      Excl. in GC?      Constitutional: Alert and oriented. Well appearing and in no acute distress. Eyes: Conjunctivae are normal. PERRL. EOMI. Head: Atraumatic. ENT:      Ears:       Nose: No congestion/rhinnorhea.      Mouth/Throat: Mucous membranes are moist.  Neck: No stridor.  No cervical spine tenderness to palpation.  Cardiovascular: Normal rate, regular rhythm. Normal S1 and S2.  Good peripheral circulation. Respiratory: Normal respiratory effort without tachypnea or retractions. Lungs CTAB. Good air entry to the bases with no decreased or absent breath sounds. Gastrointestinal: Bowel sounds 4 quadrants. Soft and nontender to palpation. No guarding or  rigidity. No palpable masses. No distention. No CVA tenderness. Musculoskeletal: Full range of motion to all extremities. No gross deformities appreciated.  No gross deformities to spine upon inspection.  No tenderness to palpation along midline spinal processes.  Tenderness to palpation over the left-sided sciatic notch.  Dorsalis pedis pulse intact bilateral lower extremity.  Sensation intact and equal in bilateral lower extremities. Neurologic:  Normal speech and language. No gross focal neurologic deficits are appreciated.  Skin:  Skin is warm, dry and intact. No rash noted. Psychiatric: Mood and affect are normal. Speech and behavior are normal. Patient exhibits appropriate insight and judgement.   ____________________________________________   LABS (all labs ordered are listed, but only abnormal results are displayed)  Labs Reviewed - No data  to display ____________________________________________  EKG   ____________________________________________  RADIOLOGY Festus Barren Cuthriell, personally viewed and evaluated these images (plain radiographs) as part of my medical decision making, as well as reviewing the written report by the radiologist.  Dg Lumbar Spine Complete  Result Date: 02/08/2017 CLINICAL DATA:  Acute on chronic back pain. EXAM: LUMBAR SPINE - COMPLETE 4+ VIEW COMPARISON:  None. FINDINGS: No acute fracture deformity or malalignment. Transitional anatomy, sacralized RIGHT L5 vertebral body. Small T12 ribs. Status post L4-5 ALIF and PLIF with intact hardware in arthrodesis. Non surgically altered disc heights preserved. No destructive bony lesions. Mild sacroiliac osteoarthrosis. Iliac vascular stents. Surgical clips in the included right abdomen compatible with cholecystectomy. IMPRESSION: No acute fracture deformity or malalignment. Status post L4-5 fusion without hardware failure. No advanced degenerative change for age. Electronically Signed   By: Awilda Metro  M.D.   On: 02/08/2017 21:02    ____________________________________________    PROCEDURES  Procedure(s) performed:    Procedures    Medications  HYDROmorphone (DILAUDID) injection 1 mg (1 mg Intramuscular Given 02/08/17 2113)  diazepam (VALIUM) tablet 5 mg (5 mg Oral Given 02/08/17 2113)     ____________________________________________   INITIAL IMPRESSION / ASSESSMENT AND PLAN / ED COURSE  Pertinent labs & imaging results that were available during my care of the patient were reviewed by me and considered in my medical decision making (see chart for details).  Review of the [place] CSRS was performed in accordance of the [facility] prior to dispensing any controlled drugs.     Patient's diagnosis is consistent with acute on chronic back pain.  Differential included loosening of hardware, acute on chronic back pain, new fracture, impingement, UTI.  Patient is having no urinary symptoms.  No previous imaging was available and x-ray was ordered.  No significant abnormality.  Hardware in place with no apparent loosening.  Patient's exam is reassuring with no indication for further workup.  Patient is given a shot of pain medication, oral Valium for muscle relaxer.  Patient will be discharged with prescription for oral Valium as a muscle relaxer, as well as meloxicam with Prilosec for GI protection..  Patient is to follow-up with neurosurgery and primary care.  Patient is given ED precautions to return to the ED for any worsening or new symptoms.     ____________________________________________  FINAL CLINICAL IMPRESSION(S) / ED DIAGNOSES  Final diagnoses:  Chronic midline low back pain with left-sided sciatica      NEW MEDICATIONS STARTED DURING THIS VISIT:  ED Discharge Orders        Ordered    diazepam (VALIUM) 2 MG tablet  Every 8 hours PRN     02/08/17 2109    meloxicam (MOBIC) 15 MG tablet  Daily     02/08/17 2109    omeprazole (PRILOSEC) 10 MG capsule  Daily      02/08/17 2109          This chart was dictated using voice recognition software/Dragon. Despite best efforts to proofread, errors can occur which can change the meaning. Any change was purely unintentional.    Lanette Hampshire 02/08/17 2117    Sharman Cheek, MD 02/11/17 [phone]

## 2017-02-08 NOTE — ED Notes (Signed)
Pt to thje er for back pain. Pt has a hx of 2 spinal fusions. Pt takes norco and tizanadine. Norco is Q 6hrs and tizanadine QHS PRN. Pt took Norco at 1130 this morning. When asked why she didn't take another, pt said it was still in the window of the 6 hours. Pain is in the lower back where it normally is. Pt [facility] wh[facility] is [facility]CP. Asked who is spine MD, pt [facility] Pt no longer goes there. Pt says she was told she could be managed by her primary care. Pt says she has been on norco for 8 years. Pt can't take steroids because she is diabetic.

## 2017-02-08 NOTE — ED Triage Notes (Signed)
Pt states history of chronic back pain. Pt states pain has been getting worse over last several weeks and worsening today. Pt states "I usually take my medication and heat and ice with rest, but it's not helping". Pt denies hematuria, fever, vomiting, dysuria.

## 2021-07-14 ENCOUNTER — Ambulatory Visit
Admission: RE | Admit: 2021-07-14 | Discharge: 2021-07-14 | Disposition: A | Discharge status: Home / Self Care | Payer: Medicare Other | Source: Ambulatory Visit | Attending: Family Medicine | Admitting: Family Medicine

## 2021-07-14 ENCOUNTER — Ambulatory Visit
Admission: EM | Admit: 2021-07-14 | Discharge: 2021-07-14 | Disposition: A | Discharge status: Home / Self Care | Payer: Medicare Other

## 2021-07-14 ENCOUNTER — Other Ambulatory Visit: Payer: Self-pay

## 2021-07-14 ENCOUNTER — Other Ambulatory Visit: Payer: Self-pay | Admitting: Family Medicine

## 2021-07-14 ENCOUNTER — Ambulatory Visit (INDEPENDENT_AMBULATORY_CARE_PROVIDER_SITE_OTHER): Payer: Medicare Other

## 2021-07-14 DIAGNOSIS — M79641 Pain in right hand: Secondary | ICD-10-CM

## 2021-07-14 DIAGNOSIS — R059 Cough, unspecified: Secondary | ICD-10-CM

## 2021-07-14 DIAGNOSIS — S60221A Contusion of right hand, initial encounter: Secondary | ICD-10-CM

## 2021-07-14 NOTE — Discharge Instructions (Signed)
You can take 650 mg of Tylenol every 4 hours for pain. ?

## 2021-07-14 NOTE — ED Triage Notes (Signed)
Pt c/o right hand pain. Pt slammed her hand in the car door on 07/13/21.  ? ?Pt is having pain extending up her forearm and states that the pain is worst along the sides of her palm. Pt can not "grasp" anything and has pain when turning her hand.  ?

## 2021-07-14 NOTE — ED Provider Notes (Signed)
? ?Provider Note ? ?Patient Contact: 8:31 AM (approximate) ? ? ?History  ? ?Hand Injury ? ? ?HPI ? ?Nicole Lewis is a 66 y.o. female presents to the urgent care with right hand pain and wrist pain after patient slammed her hand in a car door last night.  She is right-hand dominant.  No similar injuries in the past.  No numbness or tingling in the right hand. ? ?  ? ? ?Physical Exam  ? ?Triage Vital Signs: ?ED Triage Vitals  ?Enc Vitals Group  ?   BP 07/14/21 0826 (!) 73/56  ?   Pulse Rate 07/14/21 0826 85  ?   Resp 07/14/21 0826 18  ?   Temp 07/14/21 0826 98.2 ?F (36.8 ?C)  ?   Temp Source 07/14/21 0826 Oral  ?   SpO2 07/14/21 0826 95 %  ?   Weight 07/14/21 0820 220 lb (99.8 kg)  ?   Height 07/14/21 0820 5\' 5"  (1.651 m)  ?   Head Circumference --   ?   Peak Flow --   ?   Pain Score 07/14/21 0820 8  ?   Pain Loc --   ?   Pain Edu? --   ?   Excl. in GC? --   ? ? ?Most recent vital signs: ?Vitals:  ? 07/14/21 0826  ?BP: (!) 73/56  ?Pulse: 85  ?Resp: 18  ?Temp: 98.2 ?F (36.8 ?C)  ?SpO2: 95%  ? ? ? ?General: Alert and in no acute distress. ?Eyes:  PERRL. EOMI. ?Head: No acute traumatic findings ?ENT: ?     Nose: No congestion/rhinnorhea. ?     Mouth/Throat: Mucous membranes are moist. ?Neck: No stridor. No cervical spine tenderness to palpation. ?Cardiovascular:  Good peripheral perfusion ?Respiratory: Normal respiratory effort without tachypnea or retractions. Lungs CTAB. Good air entry to the bases with no decreased or absent breath sounds. ?Gastrointestinal: Bowel sounds ?4 quadrants. Soft and nontender to palpation. No guarding or rigidity. No palpable masses. No distention. No CVA tenderness. ?Musculoskeletal: Patient performs full range of motion at the right wrist.  She can move all 5 right fingers.  She can perform pronation and supination easily.  Palpable radial normal pulses bilaterally and symmetrically.  Capillary refill less than 2 seconds on the right. ?Neurologic:  No gross focal neurologic deficits are  appreciated.  ?Skin:   No rash noted ?Other: ? ? ?ED Results / Procedures / Treatments  ? ?Labs ?(all labs ordered are listed, but only abnormal results are displayed) ?Labs Reviewed - No data to display ? ? ? ?RADIOLOGY ? ?I personally viewed and evaluated these images as part of my medical decision making, as well as reviewing the written report by the radiologist. ? ?ED Provider Interpretation: Patient has no acute bony abnormality noted on x-rays of the right wrist and right hand. ? ? ?PROCEDURES: ? ?Critical Care performed: No ? ?Procedures ? ? ?MEDICATIONS ORDERED IN ED: ?Medications - No data to display ? ? ?IMPRESSION / MDM / ASSESSMENT AND PLAN / ED COURSE  ?I reviewed the triage vital signs and the nursing notes. ?             ?               ? ?Differential diagnosis includes, but is not limited to, fracture, wrist contusion, sprain ? ?Assessment and plan ?Hand pain ?66 year old female presents to the urgent care with right hand and right wrist pain. ? ?Vital signs are reassuring at triage.  On physical exam, patient was alert, active and nontoxic-appearing.  X-rays of the right wrist and right hand were reassuring without acute bony abnormality.  Recommended Tylenol as needed for pain and follow-up with orthopedics as needed. ?  ? ? ?FINAL CLINICAL IMPRESSION(S) / ED DIAGNOSES  ? ?Final diagnoses:  ?Pain of right hand  ? ? ? ?Rx / DC Orders  ? ?ED Discharge Orders   ? ? None  ? ?  ? ? ? ?Note:  This document was prepared using Dragon voice recognition software and may include unintentional dictation errors. ?  ?Orvil Feil, PA-C ?07/14/21 6237 ? ?

## 2021-07-25 ENCOUNTER — Encounter: Payer: Self-pay | Admitting: Ophthalmology

## 2021-08-01 NOTE — Discharge Instructions (Addendum)

## 2021-08-03 ENCOUNTER — Ambulatory Visit: Payer: BC Managed Care – PPO | Admitting: Anesthesiology

## 2021-08-03 ENCOUNTER — Ambulatory Visit
Admission: RE | Admit: 2021-08-03 | Discharge: 2021-08-03 | Disposition: A | Discharge status: Home / Self Care | Payer: BC Managed Care – PPO | Attending: Ophthalmology | Admitting: Ophthalmology

## 2021-08-03 ENCOUNTER — Encounter: Payer: Self-pay | Admitting: Ophthalmology

## 2021-08-03 ENCOUNTER — Other Ambulatory Visit: Payer: Self-pay

## 2021-08-03 ENCOUNTER — Encounter
Admission: RE | Disposition: A | Discharge status: Home / Self Care | Payer: Self-pay | Source: Home / Self Care | Attending: Ophthalmology

## 2021-08-03 DIAGNOSIS — Z86711 Personal history of pulmonary embolism: Secondary | ICD-10-CM | POA: Insufficient documentation

## 2021-08-03 DIAGNOSIS — Z7901 Long term (current) use of anticoagulants: Secondary | ICD-10-CM | POA: Diagnosis not present

## 2021-08-03 DIAGNOSIS — H2511 Age-related nuclear cataract, right eye: Secondary | ICD-10-CM | POA: Diagnosis present

## 2021-08-03 DIAGNOSIS — E669 Obesity, unspecified: Secondary | ICD-10-CM | POA: Diagnosis not present

## 2021-08-03 DIAGNOSIS — Z6836 Body mass index (BMI) 36.0-36.9, adult: Secondary | ICD-10-CM | POA: Diagnosis not present

## 2021-08-03 DIAGNOSIS — E1136 Type 2 diabetes mellitus with diabetic cataract: Secondary | ICD-10-CM | POA: Diagnosis not present

## 2021-08-03 DIAGNOSIS — Z01818 Encounter for other preprocedural examination: Secondary | ICD-10-CM

## 2021-08-03 DIAGNOSIS — F17290 Nicotine dependence, other tobacco product, uncomplicated: Secondary | ICD-10-CM | POA: Insufficient documentation

## 2021-08-03 HISTORY — DX: Dizziness and giddiness: R42

## 2021-08-03 HISTORY — DX: Unspecified osteoarthritis, unspecified site: M19.90

## 2021-08-03 HISTORY — PX: CATARACT EXTRACTION W/PHACO: SHX586

## 2021-08-03 HISTORY — DX: Migraine, unspecified, not intractable, without status migrainosus: G43.909

## 2021-08-03 LAB — GLUCOSE, CAPILLARY
Glucose-Capillary: 349 mg/dL — ABNORMAL HIGH (ref 70–99)
Glucose-Capillary: 325 mg/dL — ABNORMAL HIGH (ref 70–99)

## 2021-08-03 SURGERY — PHACOEMULSIFICATION, CATARACT, WITH IOL INSERTION
Anesthesia: Monitor Anesthesia Care | Site: Eye | Laterality: Right

## 2021-08-03 MED ORDER — ONDANSETRON HCL 4 MG/2ML IJ SOLN: 4.0000 mg | Freq: Once | INTRAMUSCULAR | Status: DC | PRN

## 2021-08-03 MED ORDER — TETRACAINE HCL 0.5 % OP SOLN
1.0000 [drp] | OPHTHALMIC | Status: DC | PRN
Start: 2021-08-03 — End: 2021-08-03
  Administered 2021-08-03 (×3): 1 [drp] via OPHTHALMIC

## 2021-08-03 MED ORDER — MIDAZOLAM HCL 2 MG/2ML IJ SOLN
INTRAMUSCULAR | Status: DC | PRN
  Administered 2021-08-03 (×2): 1 mg via INTRAVENOUS

## 2021-08-03 MED ORDER — ARMC OPHTHALMIC DILATING DROPS
1.0000 "application " | OPHTHALMIC | Status: DC | PRN
  Administered 2021-08-03 (×3): 1 via OPHTHALMIC

## 2021-08-03 MED ORDER — MOXIFLOXACIN HCL 0.5 % OP SOLN
OPHTHALMIC | Status: DC | PRN
  Administered 2021-08-03: 0.2 mL via OPHTHALMIC

## 2021-08-03 MED ORDER — LACTATED RINGERS IV SOLN: INTRAVENOUS | Status: DC

## 2021-08-03 MED ORDER — ACETAMINOPHEN 160 MG/5ML PO SOLN: 325.0000 mg | ORAL | Status: DC | PRN

## 2021-08-03 MED ORDER — SIGHTPATH DOSE#1 NA HYALUR & NA CHOND-NA HYALUR IO KIT
PACK | INTRAOCULAR | Status: DC | PRN
  Administered 2021-08-03: 1 via OPHTHALMIC

## 2021-08-03 MED ORDER — ACETAMINOPHEN 325 MG PO TABS: 650.0000 mg | ORAL_TABLET | ORAL | Status: DC | PRN

## 2021-08-03 MED ORDER — BRIMONIDINE TARTRATE-TIMOLOL 0.2-0.5 % OP SOLN
OPHTHALMIC | Status: DC | PRN
  Administered 2021-08-03: 1 [drp] via OPHTHALMIC

## 2021-08-03 MED ORDER — LIDOCAINE HCL (PF) 2 % IJ SOLN
INTRAOCULAR | Status: DC | PRN
  Administered 2021-08-03: 1 mL via INTRAOCULAR

## 2021-08-03 MED ORDER — SIGHTPATH DOSE#1 BSS IO SOLN
INTRAOCULAR | Status: DC | PRN
  Administered 2021-08-03: 15 mL

## 2021-08-03 MED ORDER — INSULIN LISPRO 100 UNIT/ML IJ SOLN
6.0000 [IU] | Freq: Once | INTRAMUSCULAR | Status: AC
  Administered 2021-08-03: 6 [IU] via SUBCUTANEOUS

## 2021-08-03 MED ORDER — SIGHTPATH DOSE#1 BSS IO SOLN
INTRAOCULAR | Status: DC | PRN
  Administered 2021-08-03: 57 mL via OPHTHALMIC

## 2021-08-03 MED ORDER — FENTANYL CITRATE (PF) 100 MCG/2ML IJ SOLN
INTRAMUSCULAR | Status: DC | PRN
  Administered 2021-08-03: 50 ug via INTRAVENOUS

## 2021-08-03 SURGICAL SUPPLY — 23 items
Bausch and Lomb Silicone Coated IA Handpiece ×1 IMPLANT
CANNULA ANT/CHMB 27G (MISCELLANEOUS) IMPLANT
CANNULA ANT/CHMB 27GA (MISCELLANEOUS) IMPLANT
CATARACT SUITE SIGHTPATH (MISCELLANEOUS) ×2 IMPLANT
DISSECTOR HYDRO NUCLEUS 50X22 (MISCELLANEOUS) ×2 IMPLANT
DRSG TEGADERM 2-3/8X2-3/4 SM (GAUZE/BANDAGES/DRESSINGS) ×2 IMPLANT
FEE CATARACT SUITE SIGHTPATH (MISCELLANEOUS) ×1 IMPLANT
GLOVE SURG GAMMEX PI TX LF 7.5 (GLOVE) ×2 IMPLANT
GLOVE SURG SYN 8.5  E (GLOVE) ×1
GLOVE SURG SYN 8.5 E (GLOVE) ×1 IMPLANT
GLOVE SURG SYN 8.5 PF PI (GLOVE) ×1 IMPLANT
LENS IOL RAYNER 18.5 (Intraocular Lens) ×2 IMPLANT
LENS IOL RAYONE EMV 18.5 (Intraocular Lens) IMPLANT
NDL FILTER BLUNT 18X1 1/2 (NEEDLE) ×1 IMPLANT
NEEDLE FILTER BLUNT 18X 1/2SAF (NEEDLE) ×1
NEEDLE FILTER BLUNT 18X1 1/2 (NEEDLE) ×1 IMPLANT
PACK VIT ANT 23G (MISCELLANEOUS) IMPLANT
RING MALYGIN (MISCELLANEOUS) IMPLANT
SUT ETHILON 10-0 CS-B-6CS-B-6 (SUTURE)
SUTURE EHLN 10-0 CS-B-6CS-B-6 (SUTURE) IMPLANT
SYR 3ML LL SCALE MARK (SYRINGE) ×2 IMPLANT
SYR 5ML LL (SYRINGE) ×2 IMPLANT
WATER STERILE IRR 250ML POUR (IV SOLUTION) ×2 IMPLANT

## 2021-08-03 NOTE — Anesthesia Preprocedure Evaluation (Signed)
Anesthesia Evaluation  Patient identified by MRN, date of birth, ID band Patient awake    Reviewed: Allergy & Precautions, NPO status , Patient's Chart, lab work & pertinent test results, reviewed documented beta blocker date and time   History of Anesthesia Complications Negative for: history of anesthetic complications  Airway Mallampati: II  TM Distance: <3 FB Neck ROM: Limited    Dental   Pulmonary Current Smoker (Vaping, last used yesterday 08/02/21) and Patient abstained from smoking.,    breath sounds clear to auscultation       Cardiovascular (-) angina+ Peripheral Vascular Disease  (-) DOE  Rhythm:Regular Rate:Normal     Neuro/Psych  Headaches,  Neuromuscular disease    GI/Hepatic neg GERD  ,  Endo/Other  diabetes  Renal/GU      Musculoskeletal  (+) Arthritis ,   Abdominal (+) + obese (BMI 36),   Peds  Hematology  (+) Blood dyscrasia (H/o DVT/PE, on warfarin), ,   Anesthesia Other Findings   Reproductive/Obstetrics                             Anesthesia Physical Anesthesia Plan  ASA: 3  Anesthesia Plan: MAC   Post-op Pain Management:    Induction: Intravenous  PONV Risk Score and Plan: 1 and TIVA, Midazolam and Treatment may vary due to age or medical condition  Airway Management Planned: Nasal Cannula  Additional Equipment:   Intra-op Plan:   Post-operative Plan:   Informed Consent: I have reviewed the patients History and Physical, chart, labs and discussed the procedure including the risks, benefits and alternatives for the proposed anesthesia with the patient or authorized representative who has indicated his/her understanding and acceptance.       Plan Discussed with: CRNA and Anesthesiologist  Anesthesia Plan Comments:         Anesthesia Quick Evaluation

## 2021-08-03 NOTE — Anesthesia Postprocedure Evaluation (Signed)
Anesthesia Post Note  Patient: Nicole Lewis  Procedure(s) Performed: CATARACT EXTRACTION PHACO AND INTRAOCULAR LENS PLACEMENT (IOC) RIGHT DIABETIC RAYNER LENS (Right: Eye)     Patient location during evaluation: PACU Anesthesia Type: MAC Level of consciousness: awake and alert Pain management: pain level controlled Vital Signs Assessment: post-procedure vital signs reviewed and stable Respiratory status: spontaneous breathing, nonlabored ventilation, respiratory function stable and patient connected to nasal cannula oxygen Cardiovascular status: stable and blood pressure returned to baseline Postop Assessment: no apparent nausea or vomiting Anesthetic complications: no   No notable events documented.  [name] A  [name]

## 2021-08-03 NOTE — Transfer of Care (Signed)
Immediate Anesthesia Transfer of Care Note  Patient: Nicole Lewis  Procedure(s) Performed: CATARACT EXTRACTION PHACO AND INTRAOCULAR LENS PLACEMENT (IOC) RIGHT DIABETIC RAYNER LENS (Right: Eye)  Patient Location: PACU  Anesthesia Type: MAC  Level of Consciousness: awake, alert  and patient cooperative  Airway and Oxygen Therapy: Patient Spontanous Breathing and Patient connected to supplemental oxygen  Post-op Assessment: Post-op Vital signs reviewed, Patient's Cardiovascular Status Stable, Respiratory Function Stable, Patent Airway and No signs of Nausea or vomiting  Post-op Vital Signs: Reviewed and stable  Complications: No notable events documented.

## 2021-08-03 NOTE — Op Note (Signed)
OPERATIVE NOTE  Nicole Lewis [id number] 08/03/2021   PREOPERATIVE DIAGNOSIS: Nuclear sclerotic cataract right eye. H25.11   POSTOPERATIVE DIAGNOSIS: Nuclear sclerotic cataract right eye. H25.11   PROCEDURE:  Phacoemusification with posterior chamber intraocular lens placement of the right eye  Ultrasound time: Procedure(s) with comments: CATARACT EXTRACTION PHACO AND INTRAOCULAR LENS PLACEMENT (IOC) RIGHT DIABETIC RAYNER LENS (Right) - 8.40 0:55.4  LENS:   Implant Name Type Inv. Item Serial No. Manufacturer Lot No. LRB No. Used Action  RAYNER RAYONE EMV IOL  Intraocular Lens   [employer] [id number] Right 1 Implanted      SURGEON:  Courtney Heys. Lazarus Salines, MD   ANESTHESIA:  Topical with tetracaine drops, augmented with 1% preservative-free intracameral lidocaine.   COMPLICATIONS:  None.   DESCRIPTION OF PROCEDURE:  The patient was identified in the holding room and transported to the operating room and placed in the supine position under the operating microscope.  The right eye was identified as the operative eye, which was prepped and draped in the usual sterile ophthalmic fashion.   A 1 millimeter clear-corneal paracentesis was made superotemporally. Preservative-free 1% lidocaine mixed with 1:1,000 bisulfite-free aqueous solution of epinephrine was injected into the anterior chamber. The anterior chamber was then filled with Viscoat viscoelastic. A 2.4 millimeter keratome was used to make a clear-corneal incision inferotemporally. A curvilinear capsulorrhexis was made with a cystotome and capsulorrhexis forceps. Balanced salt solution was used to hydrodissect and hydrodelineate the nucleus. Phacoemulsification was then used to remove the lens nucleus and epinucleus. The remaining cortex was then removed using the irrigation and aspiration handpiece. Provisc was then placed into the capsular bag to distend it for lens placement. A +18.50 D [id number] intraocular lens was then  injected into the capsular bag. The remaining viscoelastic was aspirated.   Wounds were hydrated with balanced salt solution.  The anterior chamber was inflated to a physiologic pressure with balanced salt solution.  No wound leaks were noted. Vigamox was injected intracamerally Timolol and Brimonidine drops were applied to the eye.  The patient was taken to the recovery room in stable condition without complications of anesthesia or surgery.  Nicole Lewis 08/03/2021, 12:14 PM

## 2021-08-03 NOTE — H&P (Signed)
[facility]   Primary Care Physician:  Pcp, No Ophthalmologist: Dr. Deberah Pelton  Pre-Procedure History & Physical: HPI:  Nicole Lewis is a 66 y.o. female here for cataract surgery.   Past Medical History:  Diagnosis Date   Arthritis    Biceps muscle strain    right   Diabetes mellitus without complication (HCC)    H/O blood clots 2013   Right arm.  X2.   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   Vertigo     Past Surgical History:  Procedure Laterality Date   aortic ileac bypass  2008   BICEPS TENDON REPAIR     SPINAL FUSION     lumbar   SPINAL FUSION      Prior to Admission medications   Medication Sig Start Date End Date Taking? Authorizing Provider  buPROPion (WELLBUTRIN XL) 150 MG 24 hr tablet Take 150 mg by mouth every morning. 07/07/21  Yes [provider]  gabapentin (NEURONTIN) 600 MG tablet Take 600 mg by mouth 3 (three) times daily. 600 mg in AM. 1200 mg in PM.   Yes [provider]  HYDROcodone-acetaminophen (NORCO) 7.5-325 MG per tablet Take 1 tablet by mouth every 4 (four) hours as needed for moderate pain.   Yes [provider]  insulin glargine (LANTUS) 100 UNIT/ML injection Inject 44 Units into the skin daily.   Yes [provider]  insulin lispro (HUMALOG) 100 UNIT/ML injection Inject 15 Units into the skin 3 (three) times daily before meals. (07/25/21 - wearing Cequr patch.  18 units  + Sliding scale)   Yes [provider]  irbesartan (AVAPRO) 75 MG tablet Take 75 mg by mouth daily. 06/06/21  Yes [provider]  metFORMIN (GLUCOPHAGE-XR) 750 MG 24 hr tablet Take 1,000 mg by mouth 2 (two) times daily.   Yes [provider]  Multiple Vitamins-Minerals (ALIVE WOMENS 50+ PO) Take by mouth daily.   Yes [provider]  PARoxetine (PAXIL) 40 MG tablet Take 40 mg by mouth every morning.   Yes [provider]  pramipexole (MIRAPEX) 0.25 MG tablet Take 0.25 mg by mouth at bedtime.   Yes [provider]  propranolol ER (INDERAL LA) 80 MG 24 hr capsule Take 80 mg by mouth daily.   Yes [provider]  rosuvastatin (CRESTOR) 40 MG tablet Take 40 mg by mouth daily.   Yes [provider]  tiZANidine (ZANAFLEX) 4 MG capsule Take 4 mg by mouth 3 (three) times daily.   Yes [provider]  warfarin (COUMADIN) 3 MG tablet Take 3 mg by mouth daily. Alternate Andris Flurry Sat Sun with 1.5 mg (07/25/21 - pt taking 2 mg daily)   Yes [provider]    Allergies as of 06/21/2021 - Review Complete 02/08/2017  Allergen Reaction Noted   Contrast media [iodinated contrast media] Anaphylaxis 11/02/2014   Tetracyclines & related Anaphylaxis 11/02/2014    Family History  Problem Relation Age of Onset   Diabetes Mother    Cancer Mother    Cancer Father    Diabetes Father     Social History   Socioeconomic History   Marital status: Married    Spouse name: Not on file   Number of children: Not on file   Years of education: Not on file   Highest education level: Not on file  Occupational History  Not on file  Tobacco Use   Smoking status: Every Day    Types: Cigarettes, E-cigarettes   Smokeless tobacco: Never   Tobacco comments:    Switched from cigarettes to vaping 2011  Vaping Use   Vaping Use: Every day   Substances: Nicotine  Substance and Sexual Activity   Alcohol use: No   Drug use: No   Sexual activity: Not on file  Other Topics Concern   Not on file  Social History Narrative   Not on file   Social Determinants of Health   Financial Resource Strain: Not on file  Food Insecurity: Not on file  Transportation Needs: Not on file  Physical Activity: Not on file  Stress: Not on file  Social Connections: Not on file  Intimate Partner Violence: Not on file    Review of Systems: See HPI, otherwise negative ROS  Physical Exam: BP  (!) 131/59   Pulse 89   Temp (!) 97.3 F (36.3 C) (Temporal)   Ht 5\' 4"  (1.626 m)   Wt 96 kg   SpO2 94%   BMI 36.34 kg/m  General:   Alert, cooperative in NAD Head:  Normocephalic and atraumatic. Respiratory:  Normal work of breathing. Cardiovascular:  RRR  Impression/Plan: Nicole Lewis is here for cataract surgery.  Risks, benefits, limitations, and alternatives regarding cataract surgery have been reviewed with the patient.  Questions have been answered.  All parties agreeable.   Lia Foyer, MD  08/03/2021, 11:20 AM

## 2021-08-04 ENCOUNTER — Encounter: Payer: Self-pay | Admitting: Ophthalmology

## 2021-08-15 NOTE — Discharge Instructions (Signed)

## 2021-08-17 ENCOUNTER — Encounter
Admission: RE | Disposition: A | Discharge status: Home / Self Care | Payer: Self-pay | Source: Home / Self Care | Attending: Ophthalmology

## 2021-08-17 ENCOUNTER — Other Ambulatory Visit: Payer: Self-pay

## 2021-08-17 ENCOUNTER — Encounter: Payer: Self-pay | Admitting: Ophthalmology

## 2021-08-17 ENCOUNTER — Ambulatory Visit: Payer: BC Managed Care – PPO | Admitting: Anesthesiology

## 2021-08-17 ENCOUNTER — Ambulatory Visit
Admission: RE | Admit: 2021-08-17 | Discharge: 2021-08-17 | Disposition: A | Discharge status: Home / Self Care | Payer: BC Managed Care – PPO | Attending: Ophthalmology | Admitting: Ophthalmology

## 2021-08-17 DIAGNOSIS — Z7984 Long term (current) use of oral hypoglycemic drugs: Secondary | ICD-10-CM | POA: Insufficient documentation

## 2021-08-17 DIAGNOSIS — Z86711 Personal history of pulmonary embolism: Secondary | ICD-10-CM | POA: Diagnosis not present

## 2021-08-17 DIAGNOSIS — E1151 Type 2 diabetes mellitus with diabetic peripheral angiopathy without gangrene: Secondary | ICD-10-CM | POA: Diagnosis not present

## 2021-08-17 DIAGNOSIS — Z86718 Personal history of other venous thrombosis and embolism: Secondary | ICD-10-CM | POA: Diagnosis not present

## 2021-08-17 DIAGNOSIS — F17210 Nicotine dependence, cigarettes, uncomplicated: Secondary | ICD-10-CM | POA: Diagnosis not present

## 2021-08-17 DIAGNOSIS — Z794 Long term (current) use of insulin: Secondary | ICD-10-CM | POA: Insufficient documentation

## 2021-08-17 DIAGNOSIS — H2512 Age-related nuclear cataract, left eye: Secondary | ICD-10-CM | POA: Insufficient documentation

## 2021-08-17 DIAGNOSIS — G709 Myoneural disorder, unspecified: Secondary | ICD-10-CM | POA: Insufficient documentation

## 2021-08-17 DIAGNOSIS — E1136 Type 2 diabetes mellitus with diabetic cataract: Secondary | ICD-10-CM | POA: Insufficient documentation

## 2021-08-17 DIAGNOSIS — M1990 Unspecified osteoarthritis, unspecified site: Secondary | ICD-10-CM | POA: Diagnosis not present

## 2021-08-17 HISTORY — PX: CATARACT EXTRACTION W/PHACO: SHX586

## 2021-08-17 LAB — GLUCOSE, CAPILLARY
Glucose-Capillary: 204 mg/dL — ABNORMAL HIGH (ref 70–99)
Glucose-Capillary: 185 mg/dL — ABNORMAL HIGH (ref 70–99)

## 2021-08-17 SURGERY — PHACOEMULSIFICATION, CATARACT, WITH IOL INSERTION
Anesthesia: Monitor Anesthesia Care | Site: Eye | Laterality: Left

## 2021-08-17 MED ORDER — ARMC OPHTHALMIC DILATING DROPS
1.0000 "application " | OPHTHALMIC | Status: DC | PRN
  Administered 2021-08-17 (×3): 1 via OPHTHALMIC

## 2021-08-17 MED ORDER — BRIMONIDINE TARTRATE-TIMOLOL 0.2-0.5 % OP SOLN
OPHTHALMIC | Status: DC | PRN
  Administered 2021-08-17: 1 [drp] via OPHTHALMIC

## 2021-08-17 MED ORDER — ACETAMINOPHEN 160 MG/5ML PO SOLN: 325.0000 mg | Freq: Once | ORAL | Status: AC

## 2021-08-17 MED ORDER — LIDOCAINE HCL (PF) 2 % IJ SOLN
INTRAOCULAR | Status: DC | PRN
  Administered 2021-08-17: 1 mL via INTRAOCULAR

## 2021-08-17 MED ORDER — SIGHTPATH DOSE#1 BSS IO SOLN
INTRAOCULAR | Status: DC | PRN
  Administered 2021-08-17: 15 mL

## 2021-08-17 MED ORDER — TETRACAINE HCL 0.5 % OP SOLN
1.0000 [drp] | OPHTHALMIC | Status: DC | PRN
  Administered 2021-08-17 (×3): 1 [drp] via OPHTHALMIC

## 2021-08-17 MED ORDER — MOXIFLOXACIN HCL 0.5 % OP SOLN
OPHTHALMIC | Status: DC | PRN
  Administered 2021-08-17: 0.2 mL via OPHTHALMIC

## 2021-08-17 MED ORDER — SIGHTPATH DOSE#1 NA HYALUR & NA CHOND-NA HYALUR IO KIT
PACK | INTRAOCULAR | Status: DC | PRN
  Administered 2021-08-17: 1 via OPHTHALMIC

## 2021-08-17 MED ORDER — SIGHTPATH DOSE#1 BSS IO SOLN
INTRAOCULAR | Status: DC | PRN
  Administered 2021-08-17: 86 mL via OPHTHALMIC

## 2021-08-17 MED ORDER — LACTATED RINGERS IV SOLN: INTRAVENOUS | Status: DC

## 2021-08-17 MED ORDER — TETRACAINE 0.5 % OP SOLN OPTIME - NO CHARGE
OPHTHALMIC | Status: DC | PRN
  Administered 2021-08-17: 2 [drp] via OPHTHALMIC

## 2021-08-17 MED ORDER — ONDANSETRON HCL 4 MG/2ML IJ SOLN
INTRAMUSCULAR | Status: DC | PRN
  Administered 2021-08-17: 4 mg via INTRAVENOUS

## 2021-08-17 MED ORDER — MIDAZOLAM HCL 2 MG/2ML IJ SOLN
INTRAMUSCULAR | Status: DC | PRN
  Administered 2021-08-17: 2 mg via INTRAVENOUS

## 2021-08-17 MED ORDER — SIGHTPATH DOSE#1 SODIUM HYALURONATE 10 MG/ML IO SOLUTION
PREFILLED_SYRINGE | INTRAOCULAR | Status: DC | PRN
  Administered 2021-08-17: 0.85 mL via INTRAOCULAR

## 2021-08-17 MED ORDER — FENTANYL CITRATE (PF) 100 MCG/2ML IJ SOLN
INTRAMUSCULAR | Status: DC | PRN
  Administered 2021-08-17 (×2): 50 ug via INTRAVENOUS

## 2021-08-17 MED ORDER — ACETAMINOPHEN 325 MG PO TABS
325.0000 mg | ORAL_TABLET | Freq: Once | ORAL | Status: AC
  Administered 2021-08-17: 650 mg via ORAL

## 2021-08-17 SURGICAL SUPPLY — 16 items
CATARACT SUITE SIGHTPATH (MISCELLANEOUS) ×2 IMPLANT
DISSECTOR HYDRO NUCLEUS 50X22 (MISCELLANEOUS) ×2 IMPLANT
DRSG TEGADERM 2-3/8X2-3/4 SM (GAUZE/BANDAGES/DRESSINGS) ×2 IMPLANT
FEE CATARACT SUITE SIGHTPATH (MISCELLANEOUS) ×1 IMPLANT
GLOVE SURG GAMMEX PI TX LF 7.5 (GLOVE) ×2 IMPLANT
GLOVE SURG SYN 8.5  E (GLOVE) ×1
GLOVE SURG SYN 8.5 E (GLOVE) ×1 IMPLANT
GLOVE SURG SYN 8.5 PF PI (GLOVE) ×1 IMPLANT
LENS IOL RAYNER 19.0 (Intraocular Lens) ×2 IMPLANT
LENS IOL RAYONE EMV 19.0 (Intraocular Lens) IMPLANT
NDL FILTER BLUNT 18X1 1/2 (NEEDLE) ×1 IMPLANT
NEEDLE FILTER BLUNT 18X 1/2SAF (NEEDLE) ×1
NEEDLE FILTER BLUNT 18X1 1/2 (NEEDLE) ×1 IMPLANT
SYR 3ML LL SCALE MARK (SYRINGE) ×2 IMPLANT
SYR 5ML LL (SYRINGE) ×2 IMPLANT
WATER STERILE IRR 250ML POUR (IV SOLUTION) ×2 IMPLANT

## 2021-08-17 NOTE — Anesthesia Procedure Notes (Signed)
Procedure Name: MAC Date/Time: 08/17/2021 12:32 PM  Performed by: Jeannene Patella, CRNAPre-anesthesia Checklist: Patient identified, Emergency Drugs available, Suction available, Timeout performed and Patient being monitored Patient Re-evaluated:Patient Re-evaluated prior to induction Oxygen Delivery Method: Nasal cannula Placement Confirmation: positive ETCO2

## 2021-08-17 NOTE — Anesthesia Preprocedure Evaluation (Signed)
Anesthesia Evaluation  Patient identified by MRN, date of birth, ID band Patient awake    Reviewed: Allergy & Precautions, H&P , NPO status , Patient's Chart, lab work & pertinent test results, reviewed documented beta blocker date and time   History of Anesthesia Complications Negative for: history of anesthetic complications  Airway Mallampati: II  TM Distance: <3 FB Neck ROM: Limited    Dental no notable dental hx.    Pulmonary Current Smoker and Patient abstained from smoking.,    Pulmonary exam normal breath sounds clear to auscultation       Cardiovascular (-) angina+ Peripheral Vascular Disease  (-) DOE Normal cardiovascular exam Rhythm:Regular Rate:Normal     Neuro/Psych  Headaches,  Neuromuscular disease    GI/Hepatic neg GERD  ,  Endo/Other  diabetes  Renal/GU      Musculoskeletal  (+) Arthritis ,   Abdominal (+) + obese (BMI 36),   Peds  Hematology  (+) Blood dyscrasia (H/o DVT/PE, on warfarin), ,   Anesthesia Other Findings   Reproductive/Obstetrics                             Anesthesia Physical  Anesthesia Plan  ASA: 2  Anesthesia Plan: MAC   Post-op Pain Management: Minimal or no pain anticipated   Induction: Intravenous  PONV Risk Score and Plan: 1 and TIVA, Midazolam and Treatment may vary due to age or medical condition  Airway Management Planned: Nasal Cannula  Additional Equipment:   Intra-op Plan:   Post-operative Plan:   Informed Consent: I have reviewed the patients History and Physical, chart, labs and discussed the procedure including the risks, benefits and alternatives for the proposed anesthesia with the patient or authorized representative who has indicated his/her understanding and acceptance.     Dental Advisory Given  Plan Discussed with: CRNA and Anesthesiologist  Anesthesia Plan Comments:         Anesthesia Quick Evaluation

## 2021-08-17 NOTE — Anesthesia Postprocedure Evaluation (Signed)
Anesthesia Post Note  Patient: Nicole Lewis  Procedure(s) Performed: CATARACT EXTRACTION PHACO AND INTRAOCULAR LENS PLACEMENT (IOC) LEFT DIABETIC RAYNOR LENS (Left: Eye)     Patient location during evaluation: PACU Anesthesia Type: MAC Level of consciousness: awake and alert and oriented Pain management: satisfactory to patient Vital Signs Assessment: post-procedure vital signs reviewed and stable Respiratory status: spontaneous breathing, nonlabored ventilation and respiratory function stable Cardiovascular status: blood pressure returned to baseline and stable Postop Assessment: Adequate PO intake and No signs of nausea or vomiting Anesthetic complications: no   No notable events documented.  Raliegh Ip

## 2021-08-17 NOTE — H&P (Signed)
[facility]   Primary Care Physician:  Pcp, No Ophthalmologist: Dr. Deberah Pelton  Pre-Procedure History & Physical: HPI:  Nicole Lewis is a 66 y.o. female here for cataract surgery.   Past Medical History:  Diagnosis Date   Arthritis    Biceps muscle strain    right   Diabetes mellitus without complication (HCC)    H/O blood clots 2013   Right arm.  X2.   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   Vertigo     Past Surgical History:  Procedure Laterality Date   aortic ileac bypass  2008   BICEPS TENDON REPAIR     CATARACT EXTRACTION W/PHACO Right 08/03/2021   Procedure: CATARACT EXTRACTION PHACO AND INTRAOCULAR LENS PLACEMENT (IOC) RIGHT DIABETIC RAYNER LENS;  Surgeon: Estanislado Pandy, MD;  Location: [facility];  Service: Ophthalmology;  Laterality: Right;  8.40 0:55.4   SPINAL FUSION     lumbar   SPINAL FUSION      Prior to Admission medications   Medication Sig Start Date End Date Taking? Authorizing Provider  buPROPion (WELLBUTRIN XL) 150 MG 24 hr tablet Take 150 mg by mouth every morning. 07/07/21  Yes [provider]  gabapentin (NEURONTIN) 600 MG tablet Take 600 mg by mouth 3 (three) times daily. 600 mg in AM. 1200 mg in PM.   Yes [provider]  HYDROcodone-acetaminophen (NORCO) 7.5-325 MG per tablet Take 1 tablet by mouth every 4 (four) hours as needed for moderate pain.   Yes [provider]  insulin lispro (HUMALOG) 100 UNIT/ML injection Inject 15 Units into the skin 3 (three) times daily before meals. (07/25/21 - wearing Cequr patch.  18 units  + Sliding scale)   Yes [provider]  irbesartan (AVAPRO) 75 MG tablet Take 75 mg by mouth daily. 06/06/21  Yes [provider]  metFORMIN (GLUCOPHAGE-XR) 750 MG 24 hr tablet Take 1,000 mg by mouth 2 (two) times daily.   Yes [provider]  Multiple Vitamins-Minerals  (ALIVE WOMENS 50+ PO) Take by mouth daily.   Yes [provider]  PARoxetine (PAXIL) 40 MG tablet Take 40 mg by mouth every morning.   Yes [provider]  pramipexole (MIRAPEX) 0.25 MG tablet Take 0.25 mg by mouth at bedtime.   Yes [provider]  propranolol ER (INDERAL LA) 80 MG 24 hr capsule Take 80 mg by mouth daily.   Yes [provider]  rosuvastatin (CRESTOR) 40 MG tablet Take 40 mg by mouth daily.   Yes [provider]  tiZANidine (ZANAFLEX) 4 MG capsule Take 4 mg by mouth 3 (three) times daily.   Yes [provider]  warfarin (COUMADIN) 3 MG tablet Take 3 mg by mouth daily. Alternate Andris Flurry Sat Sun with 1.5 mg (07/25/21 - pt taking 2 mg daily)   Yes [provider]  insulin glargine (LANTUS) 100 UNIT/ML injection Inject 44 Units into the skin daily.    [provider]    Allergies as of 06/21/2021 - Review Complete 02/08/2017  Allergen Reaction Noted   Contrast media [iodinated contrast media] Anaphylaxis 11/02/2014   Tetracyclines & related Anaphylaxis 11/02/2014    Family History  Problem Relation Age of Onset   Diabetes Mother    Cancer Mother    Cancer Father    Diabetes Father     Social History   Socioeconomic  History   Marital status: Married    Spouse name: Not on file   Number of children: Not on file   Years of education: Not on file   Highest education level: Not on file  Occupational History   Not on file  Tobacco Use   Smoking status: Every Day    Types: Cigarettes, E-cigarettes   Smokeless tobacco: Never   Tobacco comments:    Switched from cigarettes to vaping 2011  Vaping Use   Vaping Use: Every day   Substances: Nicotine  Substance and Sexual Activity   Alcohol use: No   Drug use: No   Sexual activity: Not on file  Other Topics Concern   Not on file  Social History Narrative   Not on file   Social Determinants of Health   Financial Resource Strain: Not on file   Food Insecurity: Not on file  Transportation Needs: Not on file  Physical Activity: Not on file  Stress: Not on file  Social Connections: Not on file  Intimate Partner Violence: Not on file    Review of Systems: See HPI, otherwise negative ROS  Physical Exam: BP 122/72   Pulse 83   Temp 97.8 F (36.6 C) (Temporal)   Wt 97.1 kg   SpO2 95%   BMI 36.73 kg/m  General:   Alert, cooperative in NAD Head:  Normocephalic and atraumatic. Respiratory:  Normal work of breathing. Cardiovascular:  RRR  Impression/Plan: Nicole Lewis is here for cataract surgery.  Risks, benefits, limitations, and alternatives regarding cataract surgery have been reviewed with the patient.  Questions have been answered.  All parties agreeable.   Estanislado Pandy, MD  08/17/2021, 11:41 AM

## 2021-08-17 NOTE — Transfer of Care (Signed)
Immediate Anesthesia Transfer of Care Note  Patient: Nicole Lewis  Procedure(s) Performed: CATARACT EXTRACTION PHACO AND INTRAOCULAR LENS PLACEMENT (IOC) LEFT DIABETIC RAYNOR LENS (Left: Eye)  Patient Location: PACU  Anesthesia Type: MAC  Level of Consciousness: awake, alert  and patient cooperative  Airway and Oxygen Therapy: Patient Spontanous Breathing and Patient connected to supplemental oxygen  Post-op Assessment: Post-op Vital signs reviewed, Patient's Cardiovascular Status Stable, Respiratory Function Stable, Patent Airway and No signs of Nausea or vomiting  Post-op Vital Signs: Reviewed and stable  Complications: No notable events documented.

## 2021-08-17 NOTE — Op Note (Signed)
OPERATIVE NOTE  Nicole Lewis [id number] 08/17/2021   PREOPERATIVE DIAGNOSIS: Nuclear sclerotic cataract left eye. H25.12   POSTOPERATIVE DIAGNOSIS: Nuclear sclerotic cataract left eye. H25.12   PROCEDURE:  Phacoemusification with posterior chamber intraocular lens placement of the left eye  Ultrasound time: Procedure(s) with comments: CATARACT EXTRACTION PHACO AND INTRAOCULAR LENS PLACEMENT (IOC) LEFT DIABETIC RAYNOR LENS (Left) - Diabetic 10.07 01:07.1  LENS:   Implant Name Type Inv. Item Serial No. Manufacturer Lot No. LRB No. Used Action  LENS IOL RAYNER 19.0 - [id number] Intraocular Lens LENS IOL RAYNER 19.0  [employer] [id number] Left 1 Implanted      SURGEON:  Julious Payer. Rolley Sims, MD   ANESTHESIA:  Topical with tetracaine drops, augmented with 1% preservative-free intracameral lidocaine.   COMPLICATIONS:  None.   DESCRIPTION OF PROCEDURE:  The patient was identified in the holding room and transported to the operating room and placed in the supine position under the operating microscope.  The left eye was identified as the operative eye, which was prepped and draped in the usual sterile ophthalmic fashion.   A 1 millimeter clear-corneal paracentesis was made inferotemporally. Preservative-free 1% lidocaine mixed with 1:1,000 bisulfite-free aqueous solution of epinephrine was injected into the anterior chamber. The anterior chamber was then filled with Viscoat viscoelastic. A 2.4 millimeter keratome was used to make a clear-corneal incision superotemporally. A curvilinear capsulorrhexis was made with a cystotome and capsulorrhexis forceps. Balanced salt solution was used to hydrodissect and hydrodelineate the nucleus. Phacoemulsification was then used to remove the lens nucleus and epinucleus. The remaining cortex was then removed using the irrigation and aspiration handpiece. Provisc was then placed into the capsular bag to distend it for lens placement. A +19.00 [id number] intraocular  lens was then injected into the capsular bag. The remaining viscoelastic was aspirated.   Wounds were hydrated with balanced salt solution.  The anterior chamber was inflated to a physiologic pressure with balanced salt solution.  No wound leaks were noted. Vigamox was injected intracamerally.  Timolol and Brimonidine drops were applied to the eye.  The patient was taken to the recovery room in stable condition without complications of anesthesia or surgery.  [name] 08/17/2021, 1:02 PM

## 2021-08-18 ENCOUNTER — Encounter: Payer: Self-pay | Admitting: Ophthalmology

## 2022-06-28 IMAGING — CR DG WRIST COMPLETE 3+V*R*
4 series · 4 of 4 positions shown · non-contrast
Comparison: None Available.

CLINICAL DATA: Right hand pain after car door injury yesterday.

EXAM:
RIGHT WRIST - COMPLETE 3+ VIEW

[wrist pa]
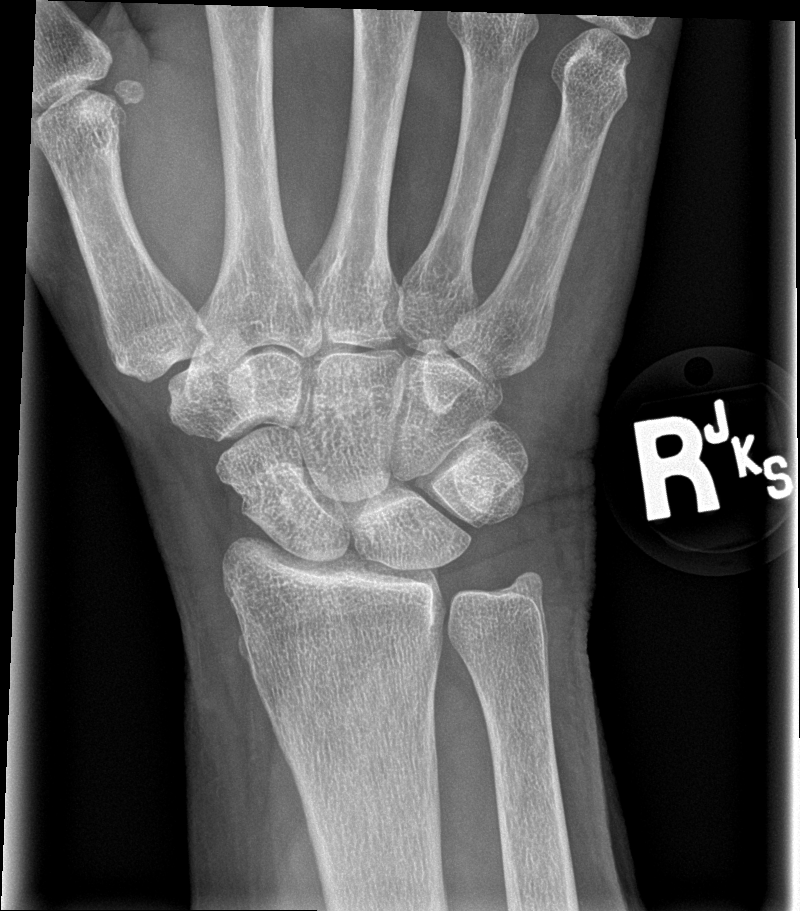

[wrist obl]
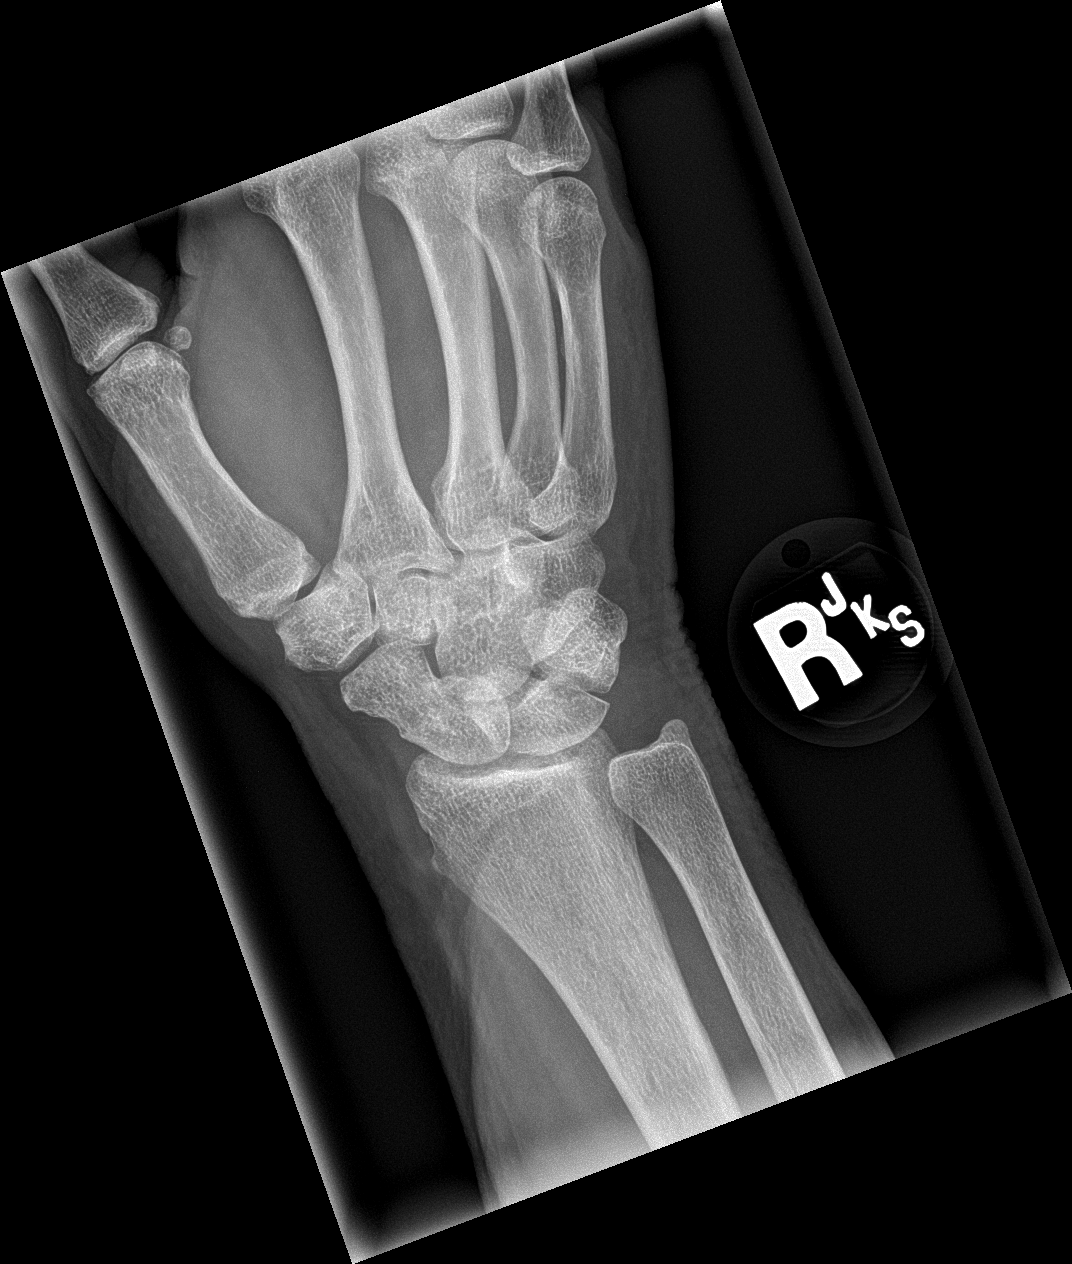

[wrist lat]
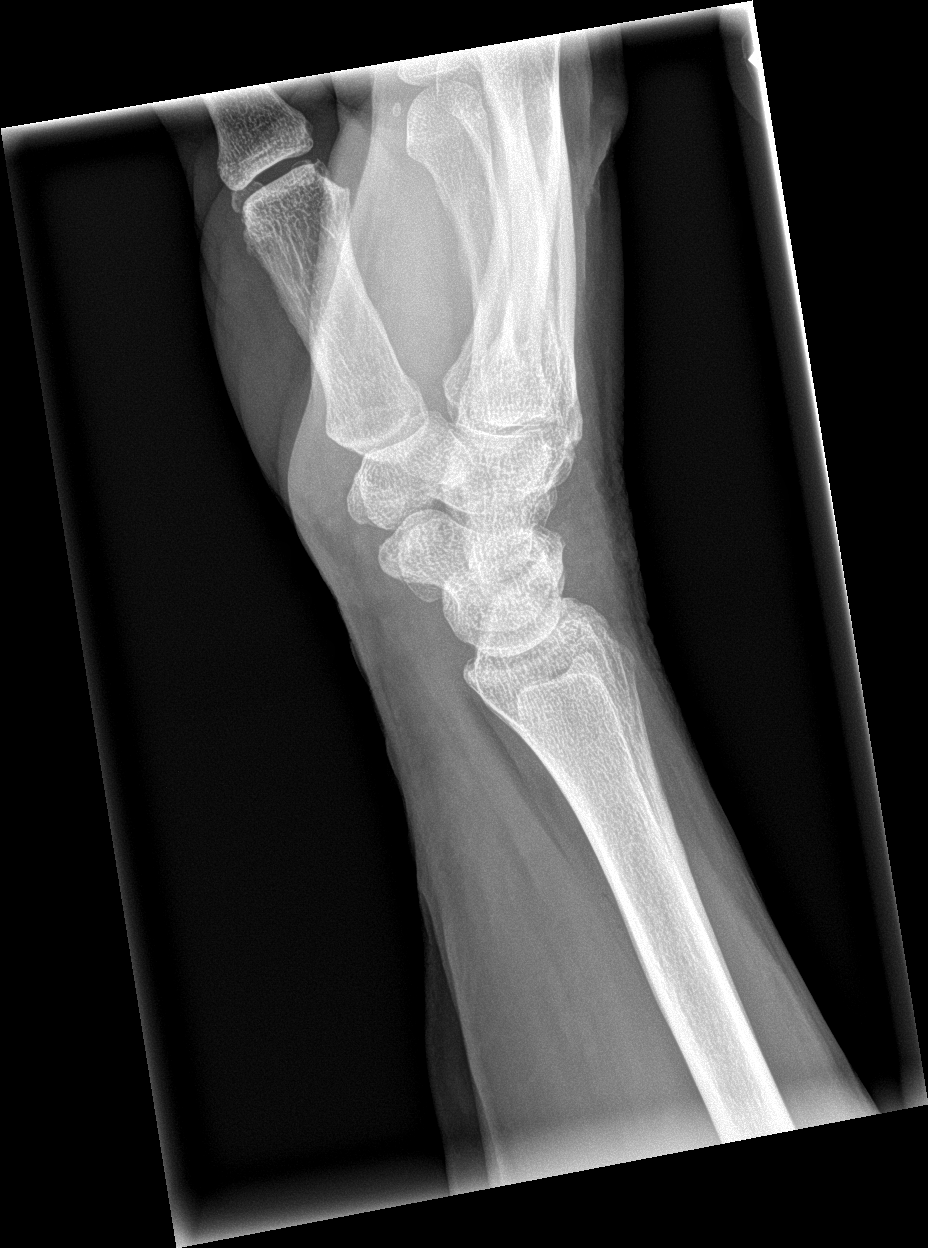

[wrist navicular]
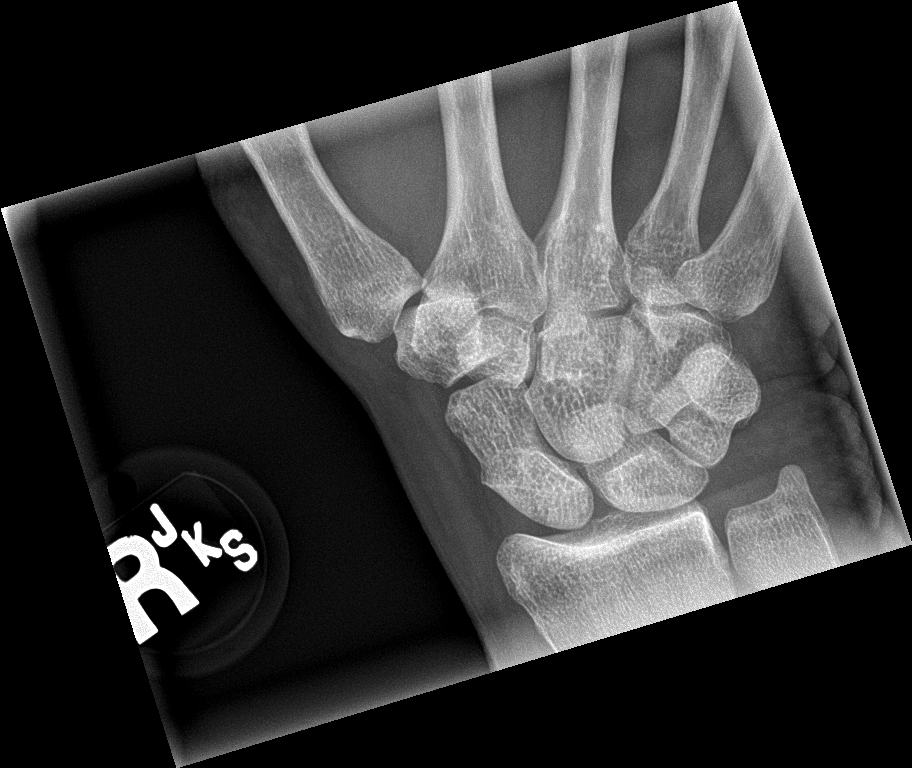

[4 of 4 positions shown; findings below may reference images not displayed]

FINDINGS: There is no evidence of fracture or dislocation. There is no
evidence of arthropathy or other focal bone abnormality. Soft
tissues are unremarkable.
IMPRESSION: Negative.

## 2022-11-03 ENCOUNTER — Emergency Department: Payer: 59

## 2022-11-03 ENCOUNTER — Encounter: Payer: Self-pay | Admitting: Emergency Medicine

## 2022-11-03 ENCOUNTER — Emergency Department
Admission: EM | Admit: 2022-11-03 | Discharge: 2022-11-03 | Disposition: A | Discharge status: Home / Self Care | Payer: 59 | Attending: Emergency Medicine | Admitting: Emergency Medicine

## 2022-11-03 ENCOUNTER — Other Ambulatory Visit: Payer: Self-pay

## 2022-11-03 DIAGNOSIS — R55 Syncope and collapse: Secondary | ICD-10-CM | POA: Diagnosis not present

## 2022-11-03 DIAGNOSIS — R112 Nausea with vomiting, unspecified: Secondary | ICD-10-CM

## 2022-11-03 DIAGNOSIS — R197 Diarrhea, unspecified: Secondary | ICD-10-CM | POA: Insufficient documentation

## 2022-11-03 DIAGNOSIS — E860 Dehydration: Secondary | ICD-10-CM | POA: Diagnosis not present

## 2022-11-03 DIAGNOSIS — U071 COVID-19: Secondary | ICD-10-CM | POA: Diagnosis not present

## 2022-11-03 LAB — BLOOD GAS, VENOUS
Acid-Base Excess: 3.7 mmol/L — ABNORMAL HIGH (ref 0.0–2.0)
Bicarbonate: 28.5 mmol/L — ABNORMAL HIGH (ref 20.0–28.0)
O2 Saturation: 76.8 %
Patient temperature: 37
pCO2, Ven: 43 mmHg — ABNORMAL LOW (ref 44–60)
pH, Ven: 7.43 (ref 7.25–7.43)
pO2, Ven: 45 mmHg (ref 32–45)

## 2022-11-03 LAB — COMPREHENSIVE METABOLIC PANEL
ALT: 32 U/L (ref 0–44)
AST: 41 U/L (ref 15–41)
Albumin: 4.2 g/dL (ref 3.5–5.0)
Alkaline Phosphatase: 72 U/L (ref 38–126)
Anion gap: 16 — ABNORMAL HIGH (ref 5–15)
BUN: 10 mg/dL (ref 8–23)
CO2: 20 mmol/L — ABNORMAL LOW (ref 22–32)
Calcium: 8.7 mg/dL — ABNORMAL LOW (ref 8.9–10.3)
Chloride: 101 mmol/L (ref 98–111)
Creatinine, Ser: 0.82 mg/dL (ref 0.44–1.00)
GFR, Estimated: >60 mL/min (ref >60)
Glucose, Bld: 245 mg/dL — ABNORMAL HIGH (ref 70–99)
Potassium: 4.2 mmol/L (ref 3.5–5.1)
Sodium: 137 mmol/L (ref 135–145)
Total Bilirubin: 2.2 mg/dL — ABNORMAL HIGH (ref 0.3–1.2)
Total Protein: 8.1 g/dL (ref 6.5–8.1)

## 2022-11-03 LAB — CBC WITH DIFFERENTIAL/PLATELET
Abs Immature Granulocytes: 0.02 10*3/uL (ref 0.00–0.07)
Basophils Absolute: 0 10*3/uL (ref 0.0–0.1)
Basophils Relative: 0 %
Eosinophils Absolute: 0.1 10*3/uL (ref 0.0–0.5)
Eosinophils Relative: 1 %
HCT: 51.7 % — ABNORMAL HIGH (ref 36.0–46.0)
Hemoglobin: 17.2 g/dL — ABNORMAL HIGH (ref 12.0–15.0)
Immature Granulocytes: 0 %
Lymphocytes Relative: 36 %
Lymphs Abs: 2.1 10*3/uL (ref 0.7–4.0)
MCH: 29.7 pg (ref 26.0–34.0)
MCHC: 33.3 g/dL (ref 30.0–36.0)
MCV: 89.3 fL (ref 80.0–100.0)
Monocytes Absolute: 0.9 10*3/uL (ref 0.1–1.0)
Monocytes Relative: 15 %
Neutro Abs: 2.7 10*3/uL (ref 1.7–7.7)
Neutrophils Relative %: 48 %
Platelets: 275 10*3/uL (ref 150–400)
RBC: 5.79 MIL/uL — ABNORMAL HIGH (ref 3.87–5.11)
RDW: 12.7 % (ref 11.5–15.5)
WBC: 5.7 10*3/uL (ref 4.0–10.5)
nRBC: 0 % (ref 0.0–0.2)

## 2022-11-03 LAB — PROTIME-INR
INR: 4 — ABNORMAL HIGH (ref 0.8–1.2)
Prothrombin Time: 39 seconds — ABNORMAL HIGH (ref 11.4–15.2)

## 2022-11-03 LAB — CBG MONITORING, ED: Glucose-Capillary: 189 mg/dL — ABNORMAL HIGH (ref 70–99)

## 2022-11-03 LAB — LIPASE, BLOOD: Lipase: 40 U/L (ref 11–51)

## 2022-11-03 LAB — TROPONIN I (HIGH SENSITIVITY): Troponin I (High Sensitivity): 4 ng/L (ref <18)

## 2022-11-03 LAB — BETA-HYDROXYBUTYRIC ACID: Beta-Hydroxybutyric Acid: 0.84 mmol/L — ABNORMAL HIGH (ref 0.05–0.27)

## 2022-11-03 MED ORDER — ONDANSETRON HCL 4 MG/2ML IJ SOLN
4.0000 mg | Freq: Once | INTRAMUSCULAR | Status: AC
  Administered 2022-11-03: 4 mg via INTRAVENOUS
  Filled 2022-11-03: qty 2

## 2022-11-03 MED ORDER — SODIUM CHLORIDE 0.9 % IV BOLUS (SEPSIS)
1000.0000 mL | Freq: Once | INTRAVENOUS | Status: AC
  Administered 2022-11-03: 1000 mL via INTRAVENOUS

## 2022-11-03 MED ORDER — ACETAMINOPHEN 500 MG PO TABS
1000.0000 mg | ORAL_TABLET | Freq: Once | ORAL | Status: AC
  Administered 2022-11-03: 1000 mg via ORAL
  Filled 2022-11-03: qty 2

## 2022-11-03 MED ORDER — INSULIN ASPART 100 UNIT/ML IJ SOLN
4.0000 [IU] | Freq: Once | INTRAMUSCULAR | Status: AC
  Administered 2022-11-03: 4 [IU] via SUBCUTANEOUS
  Filled 2022-11-03: qty 1

## 2022-11-03 MED ORDER — INSULIN ASPART 100 UNIT/ML IJ SOLN: 8.0000 [IU] | Freq: Once | INTRAMUSCULAR | Status: DC

## 2022-11-03 MED ORDER — ONDANSETRON 4 MG PO TBDP: 4.0000 mg | ORAL_TABLET | Freq: Three times a day (TID) | ORAL | 0 refills | Status: AC | PRN

## 2022-11-03 MED ORDER — SODIUM CHLORIDE 0.9 % IV BOLUS
1000.0000 mL | Freq: Once | INTRAVENOUS | Status: AC
  Administered 2022-11-03: 1000 mL via INTRAVENOUS

## 2022-11-03 MED ORDER — ONDANSETRON 4 MG PO TBDP: 4.0000 mg | ORAL_TABLET | Freq: Three times a day (TID) | ORAL | 0 refills | Status: DC | PRN

## 2022-11-03 MED ORDER — KETOROLAC TROMETHAMINE 30 MG/ML IJ SOLN
15.0000 mg | Freq: Once | INTRAMUSCULAR | Status: AC
  Administered 2022-11-03: 15 mg via INTRAVENOUS
  Filled 2022-11-03: qty 1

## 2022-11-03 NOTE — ED Notes (Signed)
Pt received 2L IVF and states that has been drinking sips of water and not vomiting for past 1 hour. Pt asking to discharge.

## 2022-11-03 NOTE — ED Notes (Signed)
Pt throwing up a small amount of clear emesis

## 2022-11-03 NOTE — ED Triage Notes (Signed)
Pt in via POV, reports testing positive for Covid yesterday; most concerned for dehydration.  Spouse states she has been having N/V/D x approximately 1 week, has tried Gatorade and Pedialyte but is unable to keep anything down.    Patient tachypneic, shivering in triage.

## 2022-11-03 NOTE — ED Notes (Signed)
This RN in to triage bathroom to check on patient, patient standing up at sink; husband calling her name and asking what's wrong.  Patient appeared to be having a near syncopal episode; at this time, I assisted patient back into wheelchair.  Patient came right back to, and able to answer questions appropriately.

## 2022-11-03 NOTE — ED Notes (Signed)
Pt at doorway asking "am I ever going to see the doctor? I have been here 6 hours." EDP made aware.

## 2022-11-03 NOTE — ED Provider Notes (Signed)
Patient feeling significantly improved tolerating p.o.  Beta hydroxy is elevated but less than 1.  VBG is normal.  This is not consistent with DKA.  She is requesting discharge home.   Willy Eddy, MD 11/03/22 1014

## 2022-11-03 NOTE — ED Provider Notes (Signed)
[facility] Provider Note    Event Date/Time   First MD Initiated Contact with Patient 11/03/22 [id number]     (approximate)   History   Covid Positive and Dehydration   HPI  Nicole Lewis is a 67 y.o. female who presents to the ED from home with a chief complaint of nausea/vomiting/diarrhea for 1 week, unable to keep down Gatorade or Pedialyte.  Also endorses chills, cough, breath, body aches.  Tested positive for COVID yesterday.  Concern for dehydration.  Denies chest pain, abdominal pain, dysuria.  Patient had near syncopal episode in the lobby restroom.  Husband was able to catch her so she did not injure herself.     Past Medical History   Past Medical History:  Diagnosis Date   Arthritis    Biceps muscle strain    right   Diabetes mellitus without complication (HCC)    H/O blood clots 2013   Right arm.  X2.   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   Vertigo      Active Problem List  There are no problems to display for this patient.    Past Surgical History   Past Surgical History:  Procedure Laterality Date   aortic ileac bypass  2008   BICEPS TENDON REPAIR     CATARACT EXTRACTION W/PHACO Right 08/03/2021   Procedure: CATARACT EXTRACTION PHACO AND INTRAOCULAR LENS PLACEMENT (IOC) RIGHT DIABETIC RAYNER LENS;  Surgeon: Estanislado Pandy, MD;  Location: [facility];  Service: Ophthalmology;  Laterality: Right;  8.40 0:55.4   CATARACT EXTRACTION W/PHACO Left 08/17/2021   Procedure: CATARACT EXTRACTION PHACO AND INTRAOCULAR LENS PLACEMENT (IOC) LEFT DIABETIC RAYNOR LENS;  Surgeon: Estanislado Pandy, MD;  Location: [facility];  Service: Ophthalmology;  Laterality: Left;  Diabetic 10.07 01:07.1   SPINAL FUSION     lumbar   SPINAL FUSION       Home Medications   Prior to Admission medications   Medication Sig Start Date End Date  Taking? Authorizing Provider  buPROPion (WELLBUTRIN XL) 150 MG 24 hr tablet Take 150 mg by mouth every morning. 07/07/21   [provider]  gabapentin (NEURONTIN) 600 MG tablet Take 600 mg by mouth 3 (three) times daily. 600 mg in AM. 1200 mg in PM.    [provider]  HYDROcodone-acetaminophen (NORCO) 7.5-325 MG per tablet Take 1 tablet by mouth every 4 (four) hours as needed for moderate pain.    [provider]  insulin glargine (LANTUS) 100 UNIT/ML injection Inject 44 Units into the skin daily.    [provider]  insulin lispro (HUMALOG) 100 UNIT/ML injection Inject 15 Units into the skin 3 (three) times daily before meals. (07/25/21 - wearing Cequr patch.  18 units  + Sliding scale)    [provider]  irbesartan (AVAPRO) 75 MG tablet Take 75 mg by mouth daily. 06/06/21   [provider]  metFORMIN (GLUCOPHAGE-XR) 750 MG 24 hr tablet Take 1,000 mg by mouth 2 (two) times daily.    [provider]  Multiple Vitamins-Minerals (ALIVE WOMENS 50+ PO) Take by mouth daily.    [provider]  PARoxetine (PAXIL) 40 MG tablet Take 40 mg by mouth every morning.    [provider]  pramipexole (MIRAPEX) 0.25 MG tablet Take 0.25 mg by mouth at bedtime.    [provider]  propranolol  ER (INDERAL LA) 80 MG 24 hr capsule Take 80 mg by mouth daily.    [provider]  rosuvastatin (CRESTOR) 40 MG tablet Take 40 mg by mouth daily.    [provider]  tiZANidine (ZANAFLEX) 4 MG capsule Take 4 mg by mouth 3 (three) times daily.    [provider]  warfarin (COUMADIN) 3 MG tablet Take 3 mg by mouth daily. Alternate Andris Flurry Sat Sun with 1.5 mg (07/25/21 - pt taking 2 mg daily)    [provider]     Allergies  Avandia [rosiglitazone], Contrast media [iodinated contrast media], Red blood cells, Tetracyclines & related, and Benadryl [diphenhydramine]   Family History   Family History   Problem Relation Age of Onset   Diabetes Mother    Cancer Mother    Cancer Father    Diabetes Father      Physical Exam  Triage Vital Signs: ED Triage Vitals  Encounter Vitals Group     BP 11/03/22 0507 103/75     Systolic BP Percentile --      Diastolic BP Percentile --      Pulse Rate 11/03/22 0507 97     Resp 11/03/22 0507 (!) 24     Temp 11/03/22 0507 99.9 F (37.7 C)     Temp Source 11/03/22 0507 Oral     SpO2 11/03/22 0506 97 %     Weight 11/03/22 0509 215 lb (97.5 kg)     Height 11/03/22 0509 5\' 5"  (1.651 m)     Head Circumference --      Peak Flow --      Pain Score 11/03/22 0508 8     Pain Loc --      Pain Education --      Exclude from Growth Chart --     Updated Vital Signs: BP (!) 116/49   Pulse 79   Temp 99.9 F (37.7 C) (Oral)   Resp 11   Ht 5\' 5"  (1.651 m)   Wt 97.5 kg   SpO2 98%   BMI 35.78 kg/m    General: Awake, mild distress.  CV:  RRR.  Good peripheral perfusion.  Resp:  Increased effort.  CTAB. Abd:  Nontender.  No distention.  Other:  Mildly dry mucous membranes.  PERRL.  EOMI.  No carotid bruits.  Neck without meningismus.  Alert and oriented x 3.  CN II-XII intact.  5/5 motor strength and sensation all extremities.  MAE x 4.   ED Results / Procedures / Treatments  Labs (all labs ordered are listed, but only abnormal results are displayed) Labs Reviewed  CBC WITH DIFFERENTIAL/PLATELET - Abnormal; Notable for the following components:      Result Value   RBC 5.79 (*)    Hemoglobin 17.2 (*)    HCT 51.7 (*)    All other components within normal limits  COMPREHENSIVE METABOLIC PANEL - Abnormal; Notable for the following components:   CO2 20 (*)    Glucose, Bld 245 (*)    Calcium 8.7 (*)    Total Bilirubin 2.2 (*)    Anion gap 16 (*)    All other components within normal limits  LIPASE, BLOOD  URINALYSIS, ROUTINE W REFLEX MICROSCOPIC  PROTIME-INR  BLOOD GAS, VENOUS  BETA-HYDROXYBUTYRIC ACID  TROPONIN I (HIGH SENSITIVITY)      EKG  Pending    RADIOLOGY I have independently visualized and interpreted patient's x-ray as well as noted the radiology interpretation:  Cxr: No acute  cardiopulmonary process  Official radiology report(s): DG Chest Port 1 View  Result Date: 11/03/2022 CLINICAL DATA:  Cough.  COVID. EXAM: PORTABLE CHEST 1 VIEW COMPARISON:  None FINDINGS: The heart size and mediastinal contours are within normal limits. Mild aortic atherosclerotic calcification. Both lungs are clear. The visualized skeletal structures are unremarkable. IMPRESSION: No active disease. Electronically Signed   By: Signa Kell M.D.   On: 11/03/2022 06:18     PROCEDURES:  Critical Care performed: No  .1-3 Lead EKG Interpretation  Performed by: Irean Hong, MD Authorized by: Irean Hong, MD     Interpretation: normal     ECG rate:  95   ECG rate assessment: normal     Rhythm: sinus rhythm     Ectopy: none     Conduction: normal   Comments:     Patient placed on cardiac monitor to evaluate for arrhythmias    MEDICATIONS ORDERED IN ED: Medications  sodium chloride 0.9 % bolus 1,000 mL (has no administration in time range)  sodium chloride 0.9 % bolus 1,000 mL (has no administration in time range)  ondansetron (ZOFRAN) injection 4 mg (has no administration in time range)  ketorolac (TORADOL) 30 MG/ML injection 15 mg (has no administration in time range)  ondansetron (ZOFRAN) injection 4 mg (4 mg Intravenous Given 11/03/22 0534)  acetaminophen (TYLENOL) tablet 1,000 mg (1,000 mg Oral Given 11/03/22 0533)     IMPRESSION / MDM / ASSESSMENT AND PLAN / ED COURSE  I reviewed the triage vital signs and the nursing notes.                             67 year old female presenting with nausea/vomiting/diarrhea x 1 week, cough with positive COVID test yesterday, near syncope in the ED bathroom. Differential includes, but is not limited to, gastroenteritis, colitis, SBO, viral syndrome, bronchitis including  COPD exacerbation, pneumonia, reactive airway disease including asthma, CHF including exacerbation with or without pulmonary/interstitial edema, pneumothorax, ACS, thoracic trauma, and pulmonary embolism.  I personally reviewed patient's records and note a PCP office visit on 09/17/2022 for anticoagulation and diabetes follow-up.  Patient's presentation is most consistent with acute presentation with potential threat to life or bodily function.  The patient is on the cardiac monitor to evaluate for evidence of arrhythmia and/or significant heart rate changes.  Will obtain cardiac panel, chest x-ray, UA.  Initiate IV fluid resuscitation, Zofran for nausea, Toradol for body aches.  Will reassess.  Clinical Course as of 11/03/22 0703  Sat Nov 03, 2022  [id number] Laboratory results demonstrate hemoconcentration likely due to dehydration, hyperglycemia with mild elevation of anion gap, elevated bilirubin likely secondary to vomiting.  Normal transaminases and lipase.  Since patient is a diabetic, will check VBG and hydroxybutyrate. [JS]  0703 Care will be transferred to Dr. Roxan Hockey at change of shift pending labwork, PO challenge and disposition. [JS]    Clinical Course User Index [JS] Irean Hong, MD     FINAL CLINICAL IMPRESSION(S) / ED DIAGNOSES   Final diagnoses:  Nausea vomiting and diarrhea  Near syncope  COVID     Rx / DC Orders   ED Discharge Orders     None        Note:  This document was prepared using Dragon voice recognition software and may include unintentional dictation errors.   Irean Hong, MD 11/03/22 [id number]
